# Patient Record
Sex: Female | Born: 1960 | Race: White | Hispanic: No | Marital: Married | Smoking: Former smoker
Health system: Southern US, Community
[De-identification: ages and names within clinical notes are randomized; demographics above are authoritative.]

## PROBLEM LIST (undated history)

## (undated) DIAGNOSIS — R569 Unspecified convulsions: Secondary | ICD-10-CM

## (undated) DIAGNOSIS — I10 Essential (primary) hypertension: Secondary | ICD-10-CM

## (undated) DIAGNOSIS — K5900 Constipation, unspecified: Secondary | ICD-10-CM

## (undated) DIAGNOSIS — K5120 Ulcerative (chronic) proctitis without complications: Secondary | ICD-10-CM

## (undated) HISTORY — DX: Ulcerative (chronic) proctitis without complications: K51.20

## (undated) HISTORY — DX: Constipation, unspecified: K59.00

## (undated) HISTORY — PX: WISDOM TOOTH EXTRACTION: SHX21

## (undated) HISTORY — DX: Essential (primary) hypertension: I10

## (undated) HISTORY — DX: Unspecified convulsions: R56.9

---

## 2004-07-08 HISTORY — PX: CEREBRAL ANEURYSM REPAIR: SHX164

## 2004-10-15 LAB — PROTIME-INR

## 2005-01-25 ENCOUNTER — Ambulatory Visit: Payer: Self-pay | Admitting: Family Medicine

## 2005-02-05 ENCOUNTER — Ambulatory Visit (HOSPITAL_COMMUNITY): Admission: RE | Admit: 2005-02-05 | Discharge: 2005-02-05 | Payer: Self-pay | Admitting: Family Medicine

## 2005-02-11 ENCOUNTER — Other Ambulatory Visit: Admission: RE | Admit: 2005-02-11 | Discharge: 2005-02-11 | Payer: Self-pay | Admitting: Family Medicine

## 2005-02-11 ENCOUNTER — Ambulatory Visit: Payer: Self-pay | Admitting: Family Medicine

## 2005-02-15 ENCOUNTER — Ambulatory Visit: Payer: Self-pay | Admitting: Family Medicine

## 2005-05-22 ENCOUNTER — Ambulatory Visit (HOSPITAL_COMMUNITY): Admission: RE | Admit: 2005-05-22 | Discharge: 2005-05-22 | Payer: Self-pay | Admitting: Internal Medicine

## 2005-05-22 ENCOUNTER — Ambulatory Visit: Payer: Self-pay | Admitting: Family Medicine

## 2005-06-13 ENCOUNTER — Encounter: Admission: RE | Admit: 2005-06-13 | Discharge: 2005-06-13 | Payer: Self-pay

## 2005-06-26 ENCOUNTER — Emergency Department (HOSPITAL_COMMUNITY): Admission: EM | Admit: 2005-06-26 | Discharge: 2005-06-26 | Payer: Self-pay | Admitting: Emergency Medicine

## 2005-06-28 ENCOUNTER — Ambulatory Visit: Payer: Self-pay | Admitting: Internal Medicine

## 2005-12-09 ENCOUNTER — Encounter: Admission: RE | Admit: 2005-12-09 | Discharge: 2005-12-09 | Payer: Self-pay | Admitting: Family Medicine

## 2005-12-09 ENCOUNTER — Ambulatory Visit: Payer: Self-pay | Admitting: Family Medicine

## 2006-02-25 ENCOUNTER — Other Ambulatory Visit: Admission: RE | Admit: 2006-02-25 | Discharge: 2006-02-25 | Payer: Self-pay | Admitting: Family Medicine

## 2006-02-25 ENCOUNTER — Ambulatory Visit: Payer: Self-pay | Admitting: Family Medicine

## 2006-03-25 ENCOUNTER — Ambulatory Visit (HOSPITAL_COMMUNITY): Admission: RE | Admit: 2006-03-25 | Discharge: 2006-03-25 | Payer: Self-pay | Admitting: Family Medicine

## 2006-04-22 ENCOUNTER — Ambulatory Visit: Payer: Self-pay | Admitting: Family Medicine

## 2006-07-16 ENCOUNTER — Ambulatory Visit: Payer: Self-pay | Admitting: Family Medicine

## 2006-07-16 LAB — CONVERTED CEMR LAB
BUN: 12 mg/dL (ref 6–23)
Basophils Absolute: 0.1 10*3/uL (ref 0.0–0.1)
Basophils Relative: 1 % (ref 0.0–1.0)
Creatinine, Ser: 0.7 mg/dL (ref 0.4–1.2)
Eosinophil percent: 1.7 % (ref 0.0–5.0)
HCT: 37.6 % (ref 36.0–46.0)
Hemoglobin: 12.5 g/dL (ref 12.0–15.0)
INR: 0.9 (ref 0.9–2.0)
Lymphocytes Relative: 21.6 % (ref 12.0–46.0)
MCHC: 33.4 g/dL (ref 30.0–36.0)
MCV: 91.1 fL (ref 78.0–100.0)
Monocytes Absolute: 0.5 10*3/uL (ref 0.2–0.7)
Monocytes Relative: 8.3 % (ref 3.0–11.0)
Neutro Abs: 3.9 10*3/uL (ref 1.4–7.7)
Neutrophils Relative %: 67.4 % (ref 43.0–77.0)
Platelets: 250 10*3/uL (ref 150–400)
Prothrombin Time: 11.7 s (ref 10.0–14.0)
RBC: 4.13 M/uL (ref 3.87–5.11)
RDW: 12.4 % (ref 11.5–14.6)
WBC: 5.9 10*3/uL (ref 4.5–10.5)
hCG, Beta Chain, Quant, S: 0.98 milliintl units/mL

## 2006-08-04 ENCOUNTER — Ambulatory Visit: Payer: Self-pay | Admitting: Family Medicine

## 2007-01-03 ENCOUNTER — Ambulatory Visit: Payer: Self-pay | Admitting: Internal Medicine

## 2007-02-26 DIAGNOSIS — I10 Essential (primary) hypertension: Secondary | ICD-10-CM | POA: Insufficient documentation

## 2007-03-11 ENCOUNTER — Ambulatory Visit: Payer: Self-pay | Admitting: Family Medicine

## 2007-03-11 ENCOUNTER — Telehealth (INDEPENDENT_AMBULATORY_CARE_PROVIDER_SITE_OTHER): Payer: Self-pay | Admitting: *Deleted

## 2007-03-18 ENCOUNTER — Encounter: Payer: Self-pay | Admitting: Family Medicine

## 2007-03-23 ENCOUNTER — Ambulatory Visit: Payer: Self-pay | Admitting: Family Medicine

## 2007-03-23 ENCOUNTER — Other Ambulatory Visit: Admission: RE | Admit: 2007-03-23 | Discharge: 2007-03-23 | Payer: Self-pay | Admitting: Family Medicine

## 2007-03-23 DIAGNOSIS — Z8669 Personal history of other diseases of the nervous system and sense organs: Secondary | ICD-10-CM | POA: Insufficient documentation

## 2007-03-23 LAB — CONVERTED CEMR LAB
ALT: 23 units/L (ref 0–35)
AST: 19 units/L (ref 0–37)
Albumin: 4 g/dL (ref 3.5–5.2)
Alkaline Phosphatase: 47 units/L (ref 39–117)
BUN: 9 mg/dL (ref 6–23)
Basophils Absolute: 0 10*3/uL (ref 0.0–0.1)
Basophils Relative: 0.8 % (ref 0.0–1.0)
Bilirubin, Direct: 0.1 mg/dL (ref 0.0–0.3)
CO2: 29 meq/L (ref 19–32)
Calcium: 9.4 mg/dL (ref 8.4–10.5)
Chloride: 107 meq/L (ref 96–112)
Cholesterol: 241 mg/dL (ref 0–200)
Creatinine, Ser: 0.6 mg/dL (ref 0.4–1.2)
Direct LDL: 177 mg/dL
Eosinophils Absolute: 0.1 10*3/uL (ref 0.0–0.6)
Eosinophils Relative: 1.7 % (ref 0.0–5.0)
GFR calc Af Amer: 137 mL/min
GFR calc non Af Amer: 113 mL/min
Glucose, Bld: 91 mg/dL (ref 70–99)
HCT: 37.7 % (ref 36.0–46.0)
HDL: 44.6 mg/dL (ref >39.0)
Hemoglobin: 12.9 g/dL (ref 12.0–15.0)
Lymphocytes Relative: 21 % (ref 12.0–46.0)
MCHC: 34.2 g/dL (ref 30.0–36.0)
MCV: 89.1 fL (ref 78.0–100.0)
Monocytes Absolute: 0.5 10*3/uL (ref 0.2–0.7)
Monocytes Relative: 7.5 % (ref 3.0–11.0)
Neutro Abs: 4.2 10*3/uL (ref 1.4–7.7)
Neutrophils Relative %: 69 % (ref 43.0–77.0)
Phenytoin Lvl: 8.2 ug/mL — ABNORMAL LOW (ref 10.0–20.0)
Platelets: 293 10*3/uL (ref 150–400)
Potassium: 4.2 meq/L (ref 3.5–5.1)
RBC: 4.23 M/uL (ref 3.87–5.11)
RDW: 13.2 % (ref 11.5–14.6)
Sodium: 141 meq/L (ref 135–145)
TSH: 1.32 microintl units/mL (ref 0.35–5.50)
Total Bilirubin: 0.7 mg/dL (ref 0.3–1.2)
Total CHOL/HDL Ratio: 5.4
Total Protein: 6.6 g/dL (ref 6.0–8.3)
Triglycerides: 135 mg/dL (ref 0–149)
VLDL: 27 mg/dL (ref 0–40)
WBC: 6.1 10*3/uL (ref 4.5–10.5)

## 2007-04-10 ENCOUNTER — Ambulatory Visit (HOSPITAL_COMMUNITY): Admission: RE | Admit: 2007-04-10 | Discharge: 2007-04-10 | Payer: Self-pay | Admitting: Family Medicine

## 2007-07-06 ENCOUNTER — Ambulatory Visit: Payer: Self-pay | Admitting: Family Medicine

## 2007-07-06 LAB — CONVERTED CEMR LAB
BUN: 9 mg/dL (ref 6–23)
Basophils Absolute: 0 10*3/uL (ref 0.0–0.1)
Basophils Relative: 0.8 % (ref 0.0–1.0)
Creatinine, Ser: 0.7 mg/dL (ref 0.4–1.2)
Eosinophils Absolute: 0.1 10*3/uL (ref 0.0–0.6)
Eosinophils Relative: 2.4 % (ref 0.0–5.0)
HCT: 38 % (ref 36.0–46.0)
Hemoglobin: 12.9 g/dL (ref 12.0–15.0)
INR: 0.9 (ref 0.8–1.0)
Lymphocytes Relative: 25.1 % (ref 12.0–46.0)
MCHC: 33.9 g/dL (ref 30.0–36.0)
MCV: 91 fL (ref 78.0–100.0)
Monocytes Absolute: 0.4 10*3/uL (ref 0.2–0.7)
Monocytes Relative: 6.5 % (ref 3.0–11.0)
Neutro Abs: 4.1 10*3/uL (ref 1.4–7.7)
Neutrophils Relative %: 65.2 % (ref 43.0–77.0)
Platelets: 260 10*3/uL (ref 150–400)
Prothrombin Time: 11.1 s (ref 10.9–13.3)
RBC: 4.18 M/uL (ref 3.87–5.11)
RDW: 13.2 % (ref 11.5–14.6)
WBC: 6.2 10*3/uL (ref 4.5–10.5)
aPTT: 26.8 s (ref 21.7–29.8)
hCG, Beta Chain, Quant, S: <0.5 milliintl units/mL

## 2008-02-11 ENCOUNTER — Ambulatory Visit: Payer: Self-pay | Admitting: Family Medicine

## 2008-02-11 LAB — CONVERTED CEMR LAB
ALT: 15 units/L (ref 0–35)
AST: 19 units/L (ref 0–37)
Albumin: 3.8 g/dL (ref 3.5–5.2)
Alkaline Phosphatase: 43 units/L (ref 39–117)
BUN: 9 mg/dL (ref 6–23)
Basophils Absolute: 0.1 10*3/uL (ref 0.0–0.1)
Basophils Relative: 0.9 % (ref 0.0–3.0)
Bilirubin, Direct: 0.1 mg/dL (ref 0.0–0.3)
CO2: 28 meq/L (ref 19–32)
Calcium: 9.1 mg/dL (ref 8.4–10.5)
Chloride: 107 meq/L (ref 96–112)
Cholesterol: 170 mg/dL (ref 0–200)
Creatinine, Ser: 0.8 mg/dL (ref 0.4–1.2)
Eosinophils Absolute: 0.1 10*3/uL (ref 0.0–0.7)
Eosinophils Relative: 2.3 % (ref 0.0–5.0)
GFR calc Af Amer: 98 mL/min
GFR calc non Af Amer: 81 mL/min
Glucose, Bld: 102 mg/dL — ABNORMAL HIGH (ref 70–99)
HCT: 36.7 % (ref 36.0–46.0)
HDL: 41.9 mg/dL (ref >39.0)
Hemoglobin: 12.5 g/dL (ref 12.0–15.0)
LDL Cholesterol: 108 mg/dL — ABNORMAL HIGH (ref 0–99)
Lymphocytes Relative: 24.1 % (ref 12.0–46.0)
MCHC: 34 g/dL (ref 30.0–36.0)
MCV: 90.2 fL (ref 78.0–100.0)
Monocytes Absolute: 0.4 10*3/uL (ref 0.1–1.0)
Monocytes Relative: 7.7 % (ref 3.0–12.0)
Neutro Abs: 3.8 10*3/uL (ref 1.4–7.7)
Neutrophils Relative %: 65 % (ref 43.0–77.0)
Platelets: 228 10*3/uL (ref 150–400)
Potassium: 4.1 meq/L (ref 3.5–5.1)
RBC: 4.07 M/uL (ref 3.87–5.11)
RDW: 12.9 % (ref 11.5–14.6)
Sodium: 140 meq/L (ref 135–145)
TSH: 1.27 microintl units/mL (ref 0.35–5.50)
Total Bilirubin: 0.9 mg/dL (ref 0.3–1.2)
Total CHOL/HDL Ratio: 4.1
Total Protein: 6.4 g/dL (ref 6.0–8.3)
Triglycerides: 101 mg/dL (ref 0–149)
VLDL: 20 mg/dL (ref 0–40)
WBC: 5.8 10*3/uL (ref 4.5–10.5)

## 2008-02-22 ENCOUNTER — Ambulatory Visit: Payer: Self-pay | Admitting: Family Medicine

## 2008-02-22 ENCOUNTER — Other Ambulatory Visit: Admission: RE | Admit: 2008-02-22 | Discharge: 2008-02-22 | Payer: Self-pay | Admitting: Family Medicine

## 2008-02-22 ENCOUNTER — Encounter: Payer: Self-pay | Admitting: Family Medicine

## 2008-02-22 DIAGNOSIS — N959 Unspecified menopausal and perimenopausal disorder: Secondary | ICD-10-CM | POA: Insufficient documentation

## 2008-04-29 ENCOUNTER — Ambulatory Visit (HOSPITAL_COMMUNITY): Admission: RE | Admit: 2008-04-29 | Discharge: 2008-04-29 | Payer: Self-pay | Admitting: Family Medicine

## 2009-01-31 ENCOUNTER — Ambulatory Visit: Payer: Self-pay | Admitting: Family Medicine

## 2009-01-31 DIAGNOSIS — T148XXA Other injury of unspecified body region, initial encounter: Secondary | ICD-10-CM | POA: Insufficient documentation

## 2009-01-31 LAB — CONVERTED CEMR LAB
ALT: 15 units/L (ref 0–35)
AST: 21 units/L (ref 0–37)
Albumin: 4.4 g/dL (ref 3.5–5.2)
Alkaline Phosphatase: 54 units/L (ref 39–117)
BUN: 12 mg/dL (ref 6–23)
Basophils Absolute: 0 10*3/uL (ref 0.0–0.1)
Basophils Relative: 0.7 % (ref 0.0–3.0)
Bilirubin Urine: NEGATIVE
Bilirubin, Direct: 0.1 mg/dL (ref 0.0–0.3)
Blood in Urine, dipstick: NEGATIVE
CO2: 31 meq/L (ref 19–32)
Calcium: 9.7 mg/dL (ref 8.4–10.5)
Chloride: 101 meq/L (ref 96–112)
Cholesterol: 214 mg/dL — ABNORMAL HIGH (ref 0–200)
Creatinine, Ser: 0.7 mg/dL (ref 0.4–1.2)
Direct LDL: 151.8 mg/dL
Eosinophils Absolute: 0.2 10*3/uL (ref 0.0–0.7)
Eosinophils Relative: 3.1 % (ref 0.0–5.0)
GFR calc non Af Amer: 93.93 mL/min (ref >60)
Glucose, Bld: 87 mg/dL (ref 70–99)
Glucose, Urine, Semiquant: NEGATIVE
HCT: 40.1 % (ref 36.0–46.0)
HDL: 50.8 mg/dL (ref >39.00)
Hemoglobin: 13.7 g/dL (ref 12.0–15.0)
Ketones, urine, test strip: NEGATIVE
Lymphocytes Relative: 26.2 % (ref 12.0–46.0)
Lymphs Abs: 1.5 10*3/uL (ref 0.7–4.0)
MCHC: 34.1 g/dL (ref 30.0–36.0)
MCV: 90.5 fL (ref 78.0–100.0)
Monocytes Absolute: 0.4 10*3/uL (ref 0.1–1.0)
Monocytes Relative: 7.3 % (ref 3.0–12.0)
Neutro Abs: 3.5 10*3/uL (ref 1.4–7.7)
Neutrophils Relative %: 62.7 % (ref 43.0–77.0)
Nitrite: NEGATIVE
Platelets: 272 10*3/uL (ref 150.0–400.0)
Potassium: 4.1 meq/L (ref 3.5–5.1)
Protein, U semiquant: NEGATIVE
RBC: 4.43 M/uL (ref 3.87–5.11)
RDW: 12 % (ref 11.5–14.6)
Sodium: 140 meq/L (ref 135–145)
Specific Gravity, Urine: 1.01
TSH: 0.93 microintl units/mL (ref 0.35–5.50)
Total Bilirubin: 1.5 mg/dL — ABNORMAL HIGH (ref 0.3–1.2)
Total CHOL/HDL Ratio: 4
Total Protein: 7.7 g/dL (ref 6.0–8.3)
Triglycerides: 66 mg/dL (ref 0.0–149.0)
Urobilinogen, UA: 0.2
VLDL: 13.2 mg/dL (ref 0.0–40.0)
WBC: 5.6 10*3/uL (ref 4.5–10.5)
pH: 5

## 2009-03-10 ENCOUNTER — Other Ambulatory Visit: Admission: RE | Admit: 2009-03-10 | Discharge: 2009-03-10 | Payer: Self-pay | Admitting: Family Medicine

## 2009-03-10 ENCOUNTER — Encounter: Payer: Self-pay | Admitting: Family Medicine

## 2009-03-10 ENCOUNTER — Ambulatory Visit: Payer: Self-pay | Admitting: Family Medicine

## 2009-04-04 ENCOUNTER — Ambulatory Visit: Payer: Self-pay | Admitting: Gastroenterology

## 2009-05-01 ENCOUNTER — Ambulatory Visit (HOSPITAL_COMMUNITY): Admission: RE | Admit: 2009-05-01 | Discharge: 2009-05-01 | Payer: Self-pay | Admitting: Family Medicine

## 2009-06-08 ENCOUNTER — Encounter (INDEPENDENT_AMBULATORY_CARE_PROVIDER_SITE_OTHER): Payer: Self-pay | Admitting: *Deleted

## 2009-07-06 ENCOUNTER — Encounter (INDEPENDENT_AMBULATORY_CARE_PROVIDER_SITE_OTHER): Payer: Self-pay | Admitting: *Deleted

## 2009-07-08 HISTORY — PX: COLONOSCOPY: SHX174

## 2009-07-11 ENCOUNTER — Ambulatory Visit: Payer: Self-pay | Admitting: Gastroenterology

## 2009-07-13 ENCOUNTER — Ambulatory Visit: Payer: Self-pay | Admitting: Family Medicine

## 2009-07-13 DIAGNOSIS — J309 Allergic rhinitis, unspecified: Secondary | ICD-10-CM | POA: Insufficient documentation

## 2009-07-21 ENCOUNTER — Ambulatory Visit: Payer: Self-pay | Admitting: Gastroenterology

## 2010-08-07 NOTE — Assessment & Plan Note (Signed)
Summary: BRUISE ON LEG/NJR   Vital Signs:  Patient profile:   50 year old female Weight:      134 pounds Temp:     98 degrees F BP sitting:   130 / 80  (right arm)  History of Present Illness: Ruth Stanley is a 50 year old female, who comes in today for evaluation of an unresolved hematoma on her right lower extremity.  Back in January.  She banged her right lower extremity in the middle of the night.  It caused a hematoma it and it bled.  The bleeding is resolved and not has still persisted.  Allergies: No Known Drug Allergies  Past History:  Past medical, surgical, family and social histories (including risk factors) reviewed, and no changes noted (except as noted below).  Past Medical History: Reviewed history from 03/23/2007 and no changes required. Hypertension Brain Aneurysm Seizure disorder  Past Surgical History: Reviewed history from 02/26/2007 and no changes required. Tubal ligation CB x1  Family History: Reviewed history from 02/26/2007 and no changes required. Family History of Stroke F 1st degree relative <60 Family History of Cardiovascular disorder  Social History: Reviewed history from 02/26/2007 and no changes required. Occupation: Married Former Smoker Alcohol use-yes Drug use-no  Review of Systems      See HPI  Physical Exam  General:  Well-developed,well-nourished,in no acute distress; alert,appropriate and cooperative throughout examination Msk:  small hematoma, right lower lateral, tibia. Pulses:  R and L carotid,radial,femoral,dorsalis pedis and posterior tibial pulses are full and equal bilaterally Extremities:  No clubbing, cyanosis, edema, or deformity noted with normal full range of motion of all joints.   Neurologic:  No cranial nerve deficits noted. Station and gait are normal. Plantar reflexes are down-going bilaterally. DTRs are symmetrical throughout. Sensory, motor and coordinative functions appear intact.   Impression &  Recommendations:  Problem # 1:  HEMATOMA (ICD-924.9) Assessment New  Complete Medication List: 1)  Lisinopril 5 Mg Tabs (Lisinopril) .... Take 1 tablet by mouth every morning 2)  Adult Aspirin Low Strength 81 Mg Tbdp (Aspirin) 3)  Lamictal 100 Mg Tabs (Lamotrigine) .... Take one tab two times a day  Other Orders: Venipuncture (50037) TLB-Lipid Panel (80061-LIPID) TLB-BMP (Basic Metabolic Panel-BMET) (04888-BVQXIHW) TLB-CBC Platelet - w/Differential (85025-CBCD) TLB-Hepatic/Liver Function Pnl (80076-HEPATIC) TLB-TSH (Thyroid Stimulating Hormone) (84443-TSH) UA Dipstick w/o Micro (automated)  (81003)  Patient Instructions: 1)  elevation and ice when that becomes throbbing. 2)  after your lab work schedule a 30 minute appointment.  The first or second week in August for your annual checkup  Laboratory Results   Urine Tests    Routine Urinalysis   Color: yellow Appearance: Clear Glucose: negative   (Normal Range: Negative) Bilirubin: negative   (Normal Range: Negative) Ketone: negative   (Normal Range: Negative) Spec. Gravity: 1.010   (Normal Range: 1.003-1.035) Blood: negative   (Normal Range: Negative) pH: 5.0   (Normal Range: 5.0-8.0) Protein: negative   (Normal Range: Negative) Urobilinogen: 0.2   (Normal Range: 0-1) Nitrite: negative   (Normal Range: Negative) Leukocyte Esterace: 1+   (Normal Range: Negative)    Comments: Joyce Gross  January 31, 2009 1:15 PM

## 2010-08-07 NOTE — Letter (Signed)
Summary: Previsit letter  [facility]  [place], [place] [address]   Phone: [phone]  Fax: [phone]       06/08/2009 MRN: [id number]  [facility] [address] [place], [place]  [address]  Dear Ms. Laduca,  Welcome to the Gastroenterology Division at [facility].    You are scheduled to see a nurse for your pre-procedure visit on 07/11/2009 at 8:00AM on the 3rd floor at [facility] [place] [employer].  We ask that you try to arrive at our office 15 minutes prior to your appointment time to allow for check-in.  Your nurse visit will consist of discussing your medical and surgical history, your immediate family medical history, and your medications.    Please bring a complete list of all your medications or, if you prefer, bring the medication bottles and we will list them.  We will need to be aware of both prescribed and over the counter drugs.  We will need to know exact dosage information as well.  If you are on blood thinners (Coumadin, Plavix, Aggrenox, Ticlid, etc.) please call our office today/prior to your appointment, as we need to consult with your physician about holding your medication.   Please be prepared to read and sign documents such as consent forms, a financial agreement, and acknowledgement forms.  If necessary, and with your consent, a friend or relative is welcome to sit-in on the nurse visit with you.  Please bring your insurance card so that we may make a copy of it.  If your insurance requires a referral to see a specialist, please bring your referral form from your primary care physician.  No co-pay is required for this nurse visit.     If you cannot keep your appointment, please call [phone] to cancel or reschedule prior to your appointment date.  This allows [place] the opportunity to schedule an appointment for another patient in need of care.    Thank you for choosing [facility] for your medical  needs.  We appreciate the opportunity to care for you.  Please visit [place] at our website  to learn more about our practice.                     Sincerely.                                                                                                                   The Gastroenterology Division

## 2010-08-07 NOTE — Miscellaneous (Signed)
Summary: [facility] Previsit/prep  Clinical Lists Changes  Medications: Added new medication of MOVIPREP 100 GM  SOLR (PEG-KCL-NACL-NASULF-NA ASC-C) As per prep instructions. - Signed Rx of MOVIPREP 100 GM  SOLR (PEG-KCL-NACL-NASULF-NA ASC-C) As per prep instructions.;  #1 x 0;  Signed;  Entered by: Emerson Monte RN;  Authorized by: Milus Banister MD;  Method [facility]: [facility]  [address] [facility] [address] [id number] [address], [place], [place]  [address], Ph: [phone] or [phone], Fax: [phone] Observations: Added new observation of NKA: T (04/04/2009 15:25)    Prescriptions: MOVIPREP 100 GM  SOLR (PEG-KCL-NACL-NASULF-NA ASC-C) As per prep instructions.  #1 x 0   Entered by:   Emerson Monte RN   Authorized by:   Milus Banister MD   Signed by:   Emerson Monte RN on 04/04/2009   Method used:   [facility]        [facility] [facility] [address] [id number]       [address]       [place], [place]  [address]       Ph: [phone] or [phone]       Fax: [phone]   RxID:   [id number]

## 2010-08-07 NOTE — Assessment & Plan Note (Signed)
Summary: ROA/FUP/RCD    History of Present Illness: we're behind and the patient got forgotten.  I called apologized to her for this.  She will be rescheduled for next Monday  Current Allergies: No known allergies         Complete Medication List: 1)  Lisinopril 5 Mg Tabs (Lisinopril) 2)  Adult Aspirin Low Strength 81 Mg Tbdp (Aspirin) 3)  Dilantin 100 Mg Caps (Phenytoin sodium extended) .... One bid     ]

## 2010-08-07 NOTE — Assessment & Plan Note (Signed)
Summary: cpx/jls   Vital Signs:  Patient Profile:   50 Years Old Female Height:     63 inches (160.02 cm) Weight:      137 pounds Temp:     98.0 degrees F oral Pulse rate:   68 / minute Pulse rhythm:   regular BP sitting:   130 / 90  (left arm) Cuff size:   regular  Vitals Entered By: Westley Hummer CMA (February 22, 2008 3:59 PM)                 Chief Complaint:  cpx.  History of Present Illness: Ruth Stanley is a 50 year old female, who comes in today for physical exam.  She had a history of a CNS aneurysm.  That was treated at [facility] in [place].  No sequelae has done well.  Her next checkup with them.  She states is 2 1/2 years from now.  She sees a neurologist, Dr. Doy Mince on a regular basis.  He has her on the Meckel under milligrams twice a day average is only taking it once a day.  She reports no seizures.  She also takes lisinopril 5 mg daily for hypertension, and states her blood pressure at home is within normal limits.  She is having trouble waking up at night with hot flushes for the past 6 months.  Her last period was in January of 2009.  She also complains of frequent urination with no other urinary tract symptoms.  She has nocturia x 1    Current Allergies: No known allergies   Past Medical History:    Reviewed history from 03/23/2007 and no changes required:       Hypertension       Brain Aneurysm       Seizure disorder   Family History:    Reviewed history from 02/26/2007 and no changes required:       Family History of Stroke F 1st degree relative <60       Family History of Cardiovascular disorder  Social History:    Reviewed history from 02/26/2007 and no changes required:       Occupation:       Married       Former Smoker       Alcohol use-yes       Drug use-no    Review of Systems      See HPI   Physical Exam  General:     Well-developed,well-nourished,in no acute distress; alert,appropriate and cooperative  throughout examination Head:     Normocephalic and atraumatic without obvious abnormalities. No apparent alopecia or balding. Eyes:     No corneal or conjunctival inflammation noted. EOMI. Perrla. Funduscopic exam benign, without hemorrhages, exudates or papilledema. Vision grossly normal. Ears:     External ear exam shows no significant lesions or deformities.  Otoscopic examination reveals clear canals, tympanic membranes are intact bilaterally without bulging, retraction, inflammation or discharge. Hearing is grossly normal bilaterally. Nose:     External nasal examination shows no deformity or inflammation. Nasal mucosa are pink and moist without lesions or exudates. Mouth:     Oral mucosa and oropharynx without lesions or exudates.  Teeth in good repair. Neck:     No deformities, masses, or tenderness noted. Chest Wall:     No deformities, masses, or tenderness noted. Breasts:     No mass, nodules, thickening, tenderness, bulging, retraction, inflamation, nipple discharge or skin changes noted.   Lungs:     Normal respiratory  effort, chest expands symmetrically. Lungs are clear to auscultation, no crackles or wheezes. Heart:     Normal rate and regular rhythm. S1 and S2 normal without gallop, murmur, click, rub or other extra sounds. Abdomen:     Bowel sounds positive,abdomen soft and non-tender without masses, organomegaly or hernias noted. Rectal:     No external abnormalities noted. Normal sphincter tone. No rectal masses or tenderness. Genitalia:     Pelvic Exam:        External: normal female genitalia without lesions or masses        Vagina: normal without lesions or masses        Cervix: normal without lesions or masses        Adnexa: normal bimanual exam without masses or fullness        Uterus: normal by palpation        Pap smear: performed Msk:     No deformity or scoliosis noted of thoracic or lumbar spine.   Pulses:     R and L carotid,radial,femoral,dorsalis  pedis and posterior tibial pulses are full and equal bilaterally Extremities:     No clubbing, cyanosis, edema, or deformity noted with normal full range of motion of all joints.   Neurologic:     No cranial nerve deficits noted. Station and gait are normal. Plantar reflexes are down-going bilaterally. DTRs are symmetrical throughout. Sensory, motor and coordinative functions appear intact. Skin:     Intact without suspicious lesions or rashes Cervical Nodes:     No lymphadenopathy noted Axillary Nodes:     No palpable lymphadenopathy Inguinal Nodes:     No significant adenopathy Psych:     Cognition and judgment appear intact. Alert and cooperative with normal attention span and concentration. No apparent delusions, illusions, hallucinations    Impression & Recommendations:  Problem # 1:  SEIZURE DISORDER (ICD-780.39) Assessment: Unchanged  The following medications were removed from the medication list:    Dilantin 100 Mg Caps (Phenytoin sodium extended) ..... One bid  Her updated medication list for this problem includes:    Lamictal 100 Mg Tabs (Lamotrigine) .Marland Kitchen... Take one tab two times a day   Problem # 2:  HYPERTENSION (ICD-401.9) Assessment: Improved  Her updated medication list for this problem includes:    Lisinopril 5 Mg Tabs (Lisinopril) .Marland Kitchen... Take 1 tablet by mouth every morning   Problem # 3:  PERIMENOPAUSAL SYNDROME (ICD-627.9) Assessment: New  Complete Medication List: 1)  Lisinopril 5 Mg Tabs (Lisinopril) .... Take 1 tablet by mouth every morning 2)  Adult Aspirin Low Strength 81 Mg Tbdp (Aspirin) 3)  Lamictal 100 Mg Tabs (Lamotrigine) .... Take one tab two times a day  Other Orders: EKG w/ Interpretation (93000)   Patient Instructions: 1)  It is important that you exercise regularly at least 20 minutes 5 times a week. If you develop chest pain, have severe difficulty breathing, or feel very tired , stop exercising immediately and seek medical  attention. 2)  Schedule your mammogram. 3)  Take calcium +Vitamin D daily. 4)  Take an Aspirin every day 5)  he may try over-the-counter medications, like a black cohosh to see if this will help stop the hot flushes.  If they get worse.  Please return for reevaluation.   6)  Please schedule a follow-up appointment in 1 year.   Prescriptions: LISINOPRIL 5 MG TABS (LISINOPRIL) Take 1 tablet by mouth every morning  #100 x 3   Entered and Authorized by:  Dorena Cookey MD   Signed by:   Dorena Cookey MD on 02/22/2008   Method used:   [facility] ...       [facility] [id number]       [address]       [place], [place]  [address]       Ph: [phone] or [phone]       Fax: [phone]   RxID:   [id number]  ]

## 2010-08-07 NOTE — Progress Notes (Signed)
Summary: NEEDS NEURO REFERRAL  Phone Note Call from Patient Call back at Home Phone [phone]   Caller: Spouse- ERIC Call For: DR TODD Summary of Call: WAS [place]. HER NEURO IN [place] WANTS HER TO SEE A NEURO DOCTOR HERE. PT IS REQUESTING A REFERRAL TO A NEUROLOGIST. Initial call taken by: Despina Arias,  March 11, 2007 9:40 AM  Follow-up for Phone Call        please call go for neurologic and get her set up for neurology consult.  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  She's done well but recently had a seizure.  She is on Dilantin 10 mg a day.  It's okay to send them a copy of her medical records.  See if Dr. love or Dr. Maude Leriche are available Follow-up by: Dorena Cookey MD,  March 11, 2007 10:49 AM  Additional Follow-up for Phone Call Additional follow up Details #1::        Faxed referral to [facility] Additional Follow-up by: Rolinda Roan,  March 11, 2007 11:16 AM         Appended Document: NEEDS NEURO REFERRAL 10/24 @ 9:30 with Dr.Reynolds/patient aware

## 2010-08-07 NOTE — Miscellaneous (Signed)
Summary: DIR COL/SCRNING...AS.    Clinical Lists Changes  Medications: Added new medication of MOVIPREP 100 GM  SOLR (PEG-KCL-NACL-NASULF-NA ASC-C) As directed - Signed Rx of MOVIPREP 100 GM  SOLR (PEG-KCL-NACL-NASULF-NA ASC-C) As directed;  #1 x 0;  Signed;  Entered by: Ernestine Conrad RN;  Authorized by: Milus Banister MD;  Method used: [facility] [id number] [address], [place], [place]  [address], Ph: [phone] or [phone], Fax: [phone] Observations: Added new observation of ALLERGY REV: Done (07/11/2009 7:50)    Prescriptions: MOVIPREP 100 GM  SOLR (PEG-KCL-NACL-NASULF-NA ASC-C) As directed  #1 x 0   Entered by:   Ernestine Conrad RN   Authorized by:   Milus Banister MD   Signed by:   Ernestine Conrad RN on 07/11/2009   Method used:   [facility]        [facility] [id number]       [address]       [place], [place]  [address]       Ph: [phone] or [phone]       Fax: [phone]   RxID:   [id number]

## 2010-08-07 NOTE — Assessment & Plan Note (Signed)
Summary: sinuses//ccm/pt rescd//ccm   Vital Signs:  Patient profile:   50 year old female Menstrual status:  perimenopausal Temp:     98.5 degrees F oral BP sitting:   110 / 78  (left arm) Cuff size:   regular  Vitals Entered By: Westley Hummer CMA Deborra Medina) (July 13, 2009 2:14 PM)  Reason for Visit sinus pressure  History of Present Illness: Ruth Stanley is a 50 year old, married female, nonsmoker, who comes in with a two week history of head congestion she has no fever, earache, sore throat, or cough.  Environmentally they have a dog at home, feather pillows, and down comforters.  Review of systems otherwise negative  Allergies (verified): No Known Drug Allergies  Past History:  Past medical, surgical, family and social histories (including risk factors) reviewed for relevance to current acute and chronic problems.  Past Medical History: Reviewed history from 03/23/2007 and no changes required. Hypertension Brain Aneurysm Seizure disorder  Past Surgical History: Reviewed history from 02/26/2007 and no changes required. Tubal ligation CB x1  Family History: Reviewed history from 02/26/2007 and no changes required. Family History of Stroke F 1st degree relative <60 Family History of Cardiovascular disorder  Social History: Reviewed history from 02/26/2007 and no changes required. Occupation: Married Former Smoker Alcohol use-yes Drug use-no  Review of Systems      See HPI  Physical Exam  General:  Well-developed,well-nourished,in no acute distress; alert,appropriate and cooperative throughout examination Head:  Normocephalic and atraumatic without obvious abnormalities. No apparent alopecia or balding. Eyes:  No corneal or conjunctival inflammation noted. EOMI. Perrla. Funduscopic exam benign, without hemorrhages, exudates or papilledema. Vision grossly normal. Ears:  External ear exam shows no significant lesions or deformities.  Otoscopic examination reveals  clear canals, tympanic membranes are intact bilaterally without bulging, retraction, inflammation or discharge. Hearing is grossly normal bilaterally. Nose:  septum and the 4+ nasal edema Mouth:  Oral mucosa and oropharynx without lesions or exudates.  Teeth in good repair.   Problems:  Medical Problems Added: 1)  Dx of Rhinitis  (ICD-477.9)  Impression & Recommendations:  Problem # 1:  RHINITIS (BJS-283.9) Assessment New  Her updated medication list for this problem includes:    Flonase 50 Mcg/act Susp (Fluticasone propionate) ..... Uad  Orders: Prescription Created Electronically [phone])  Complete Medication List: 1)  Lisinopril 5 Mg Tabs (Lisinopril) .... Take 1 tablet by mouth every morning 2)  Adult Aspirin Low Strength 81 Mg Tbdp (Aspirin) 3)  Lamictal 100 Mg Tabs (Lamotrigine) .... Take one tab two times a day 4)  Prednisone 20 Mg Tabs (Prednisone) .... Uad 5)  Flonase 50 Mcg/act Susp (Fluticasone propionate) .... Uad  Patient Instructions: 1)  environmental.......... no feather pillows, wool , or down comforters. 2)  Begin prednisone two tabs x 3 days, one x 3 days, a half x 3 days, then a half a tablet Monday, Wednesday, Friday, for a two-week taper. 3)  At that time.  Use afrin  nasal spray, steroid nasal spray, and then irrigate both nostrils with warm salt water with thenetti pot.  There is a 5 night limited on the afrin  right eyePrescriptions: FLONASE 50 MCG/ACT SUSP (FLUTICASONE PROPIONATE) UAD  #1 x 2   Entered and Authorized by:   Dorena Cookey MD   Signed by:   Dorena Cookey MD on 07/13/2009   Method used:   [facility]        [facility] [id number]  [address]       [place], [place]  [address]       Ph: [phone] or [phone]       Fax: [phone]   RxID:   [id number] PREDNISONE 20 MG TABS (PREDNISONE) UAD  #30 x 1   Entered and Authorized by:   Dorena Cookey MD   Signed by:   Dorena Cookey MD on 07/13/2009   Method  used:   [facility]        [facility] [id number]       [address]       [place],   [address]       Ph: [phone] or [phone]       Fax: [phone]   RxID:   [id number]

## 2010-08-07 NOTE — Assessment & Plan Note (Signed)
Summary: cpx//pap//lh   Vital Signs:  Patient profile:   50 year old female Menstrual status:  perimenopausal Height:      64 inches Weight:      130 pounds BMI:     22.40 Temp:     98.2 degrees F oral BP sitting:   110 / 80  (left arm) Cuff size:   regular  Vitals Entered By: Westley Hummer CMA Deborra Medina) (March 10, 2009 3:10 PM)  Reason for Visit cpx  History of Present Illness: Ruth Stanley  is a 50 year old female, who comes in today for examination  She is perimenopausal.  Her last period was in May.  Cardiovascular.  Six months ago.  She's having some minor symptoms, but nothing major.  She takes lisinopril, 5 mg daily for hypertension, and BP 110/80.  She also takes Lamictal 100 mg b.i.d. for mood via her psychiatrist, Dr.  Silversmith.  However, she is not getting any therapy that is writing her medications.  We will therefore write her medications here.  She gets routine eye care.  Dental care does BSE monthly and gets any mammography.  Colonoscopy is due.  She is 50.  Last tetanus 2,006.  neurologically stable.  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  Past Medical History: Reviewed history from 03/23/2007 and no changes required. Hypertension Brain Aneurysm Seizure disorder  Past Surgical History: Reviewed history from 02/26/2007 and no changes required. Tubal ligation CB x1  Family History: Reviewed history from 02/26/2007 and no changes required. Family History of Stroke F 1st degree relative <60 Family History of Cardiovascular disorder  Social History: Reviewed history from 02/26/2007 and no changes required. Occupation: Married Former Smoker Alcohol use-yes Drug use-no  Review of Systems      See HPI  Physical Exam  General:   Well-developed,well-nourished,in no acute distress; alert,appropriate and cooperative throughout examination Head:  Normocephalic and atraumatic without obvious abnormalities. No apparent alopecia or balding. Eyes:  No corneal or conjunctival inflammation noted. EOMI. Perrla. Funduscopic exam benign, without hemorrhages, exudates or papilledema. Vision grossly normal. Ears:  External ear exam shows no significant lesions or deformities.  Otoscopic examination reveals clear canals, tympanic membranes are intact bilaterally without bulging, retraction, inflammation or discharge. Hearing is grossly normal bilaterally. Nose:  External nasal examination shows no deformity or inflammation. Nasal mucosa are pink and moist without lesions or exudates. Mouth:  Oral mucosa and oropharynx without lesions or exudates.  Teeth in good repair. Neck:  No deformities, masses, or tenderness noted. Chest Wall:  No deformities, masses, or tenderness noted. Breasts:  No mass, nodules, thickening, tenderness, bulging, retraction, inflamation, nipple discharge or skin changes noted.   Lungs:  Normal respiratory effort, chest expands symmetrically. Lungs are clear to auscultation, no crackles or wheezes. Heart:  Normal rate and regular rhythm. S1 and S2 normal without gallop, murmur, click, rub or other extra sounds. Abdomen:  Bowel sounds positive,abdomen soft and non-tender without masses, organomegaly or hernias noted. Rectal:  No external abnormalities noted. Normal sphincter tone. No rectal masses or tenderness. Genitalia:  Pelvic Exam:        External: normal female genitalia without lesions or masses        Vagina: normal without lesions or masses        Cervix: normal without lesions or masses  Adnexa: normal bimanual exam without masses or fullness        Uterus: normal by palpation        Pap smear: performed Msk:  No deformity or scoliosis noted of thoracic or lumbar spine.   Pulses:  R and L  carotid,radial,femoral,dorsalis pedis and posterior tibial pulses are full and equal bilaterally Extremities:  No clubbing, cyanosis, edema, or deformity noted with normal full range of motion of all joints.   Neurologic:  No cranial nerve deficits noted. Station and gait are normal. Plantar reflexes are down-going bilaterally. DTRs are symmetrical throughout. Sensory, motor and coordinative functions appear intact. Skin:  Intact without suspicious lesions or rashes Cervical Nodes:  No lymphadenopathy noted Axillary Nodes:  No palpable lymphadenopathy Inguinal Nodes:  No significant adenopathy Psych:  Cognition and judgment appear intact. Alert and cooperative with normal attention span and concentration. No apparent delusions, illusions, hallucinations   Impression & Recommendations:  Problem # 1:  PERIMENOPAUSAL SYNDROME (ICD-627.9) Assessment Unchanged  Orders: Prescription Created Electronically [phone])  Problem # 2:  HYPERTENSION (ICD-401.9) Assessment: Improved  Her updated medication list for this problem includes:    Lisinopril 5 Mg Tabs (Lisinopril) .Marland Kitchen... Take 1 tablet by mouth every morning  Orders: Prescription Created Electronically [phone])  Complete Medication List: 1)  Lisinopril 5 Mg Tabs (Lisinopril) .... Take 1 tablet by mouth every morning 2)  Adult Aspirin Low Strength 81 Mg Tbdp (Aspirin) 3)  Lamictal 100 Mg Tabs (Lamotrigine) .... Take one tab two times a day  Other Orders: Gastroenterology Referral (GI)  Patient Instructions: 1)  Please schedule a follow-up appointment in 1 year. 2)  It is important that you exercise regularly at least 20 minutes 5 times a week. If you develop chest pain, have severe difficulty breathing, or feel very tired , stop exercising immediately and seek medical attention. 3)  Schedule your mammogram. 4)  Schedule a colonoscopy/sigmoidoscopy to help detect colon cancer. 5)  Take calcium +Vitamin D daily. 6)  Take an Aspirin every  day. Prescriptions: LAMICTAL 100 MG  TABS (LAMOTRIGINE) take one tab two times a day  #200 x 3   Entered and Authorized by:   Dorena Cookey MD   Signed by:   Dorena Cookey MD on 03/10/2009   Method used:   Print then Give to Patient   RxID:   [id number] LISINOPRIL 5 MG TABS (LISINOPRIL) Take 1 tablet by mouth every morning  #100 x 3   Entered and Authorized by:   Dorena Cookey MD   Signed by:   Dorena Cookey MD on 03/10/2009   Method used:   Print then Give to Patient   RxID:   [id number]

## 2010-08-07 NOTE — Letter (Signed)
Summary: [facility] Instructions  [facility]  [place], [place] [address]   Phone: [phone]  Fax: [phone]       Ruth Stanley    [birth year]    MRN: [id number]        Procedure Day Sudie Grumbling:  Wendi Snipes  07/21/09     Arrival Time:  8:30AM     Procedure Time:9:30AM     Location of Procedure:                    [facility]  [facility] ([address]th Floor)   [place]   Starting 5 days prior to your procedure 1/9/11do not eat nuts, seeds, popcorn, corn, beans, peas,  salads, or any raw vegetables.  Do not take any fiber supplements (e.g. Metamucil, Citrucel, and Benefiber).  THE DAY BEFORE YOUR PROCEDURE         DATE: 07/20/09  DAY: THURSDAY  1.  Drink clear liquids the entire day-NO SOLID FOOD  2.  Do not drink anything colored red or purple.  Avoid juices with pulp.  No orange juice.  3.  Drink at least 64 oz. (8 glasses) of fluid/clear liquids during the day to prevent dehydration and help the prep work efficiently.  CLEAR LIQUIDS INCLUDE: Water Jello Ice Popsicles Tea (sugar ok, no milk/cream) Powdered fruit flavored drinks Coffee (sugar ok, no milk/cream) Gatorade Juice: apple, white grape, white cranberry  Lemonade Clear bullion, consomm, broth Carbonated beverages (any kind) Strained chicken noodle soup Hard Candy                             4.  In the morning, mix first dose of MoviPrep solution:    Empty 1 Pouch A and 1 Pouch B into the disposable container    Add lukewarm drinking water to the top line of the container. Mix to dissolve    Refrigerate (mixed solution should be used within 24 hrs)  5.  Begin drinking the prep at 5:00 p.m. The MoviPrep container is divided by 4 marks.   Every 15 minutes drink the solution down to the next mark (approximately 8 oz) until the full liter is complete.   6.  Follow completed prep with 16 oz of clear liquid of your choice (Nothing red or purple).   Continue to drink clear liquids until bedtime.  7.  Before going to bed, mix second dose of MoviPrep solution:    Empty 1 Pouch A and 1 Pouch B into the disposable container    Add lukewarm drinking water to the top line of the container. Mix to dissolve    Refrigerate  THE DAY OF YOUR PROCEDURE      DATE: 07/21/09 DAY: FRIDAY  Beginning at 4:30a.m. (5 hours before procedure):         1. Every 15 minutes, drink the solution down to the next mark (approx 8 oz) until the full liter is complete.  2. Follow completed prep with 16 oz. of clear liquid of your choice.    3. You may drink clear liquids until 7:30AM (2 HOURS BEFORE PROCEDURE).   MEDICATION INSTRUCTIONS  Unless otherwise instructed, you should take regular prescription medications with a small sip of water   as early as possible the morning of your procedure.           OTHER INSTRUCTIONS  You will need a responsible adult at least 52  years of age to accompany you and drive you home.   This person must remain in the waiting room during your procedure.  Wear loose fitting clothing that is easily removed.  Leave jewelry and other valuables at home.  However, you may wish to bring a book to read or  an iPod/MP3 player to listen to music as you wait for your procedure to start.  Remove all body piercing jewelry and leave at home.  Total time from sign-in until discharge is approximately 2-3 hours.  You should go home directly after your procedure and rest.  You can resume normal activities the  day after your procedure.  The day of your procedure you should not:   Drive   Make legal decisions   Operate machinery   Drink alcohol   Return to work  You will receive specific instructions about eating, activities and medications before you leave.    The above instructions have been reviewed and explained to me by   Ernestine Conrad, RN_______________________    I fully understand and can verbalize these  instructions _____________________________ Date _________

## 2010-08-07 NOTE — Assessment & Plan Note (Signed)
Summary: PAP/ROA/PT RESCD/CCM   Vital Signs:  Patient Profile:   50 Years Old Female Height:     63 inches (160.02 cm) Weight:      141 pounds (64.09 kg) Temp:     98.1 degrees F (36.72 degrees C) oral BP sitting:   130 / 80  (right arm)  Pt. in pain?   no  Vitals Entered By: Vilinda Blanks, RN (March 23, 2007 8:57 AM)                  Chief Complaint:  F/U & PAP .  History of Present Illness: Ruth Stanley is a 50 year old, married female, gravida one, para one, AB zero, who at age 1.  He continues to have regular periods.  She had a BTL for birth control.  She is beginning to have some hot flashes, but there minimal.  She takes Dilantin hundred milligrams b.i.d. for his seizure disorder.  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  That was in December of 2006.  She is asymptomatic until about 6 weeks ago, when she had a seizure.  She was seen by [place] placed on Dilantin hundred milligrams b.i.d.  She has a neurology consult October 24 for further evaluation.  She also takes lisinopril 10 mg a half a tablet a day for hypertension and an 81-mg baby aspirin.  She is up in our health maintenance.  Activities.  Family history via systems, etc. it was reviewed in detail the been no changes.  Acute Visit History:      She denies abdominal pain, chest pain, constipation, cough, diarrhea, earache, eye symptoms, fever, genitourinary symptoms, headache, musculoskeletal symptoms, nasal discharge, nausea, rash, sinus problems, sore throat, and vomiting.         Current Allergies: No known allergies   Past Medical History:    Hypertension    Brain Aneurysm    Seizure disorder   Family History:    Reviewed history from 02/26/2007 and no changes required:       Family History of Stroke F 1st degree relative <60       Family History of Cardiovascular disorder  Social History:    Reviewed history from 02/26/2007 and no changes required:  Occupation:       Married       Former Smoker       Alcohol use-yes       Drug use-no   Risk Factors:  Exercise:  no   Review of Systems      See HPI   Physical Exam  General:     Well-developed,well-nourished,in no acute distress; alert,appropriate and cooperative throughout examination Head:     Normocephalic and atraumatic without obvious abnormalities. No apparent alopecia or balding. Eyes:     No corneal or conjunctival inflammation noted. EOMI. Perrla. Funduscopic exam benign, without hemorrhages, exudates or papilledema. Vision grossly normal. Ears:     External ear exam shows no significant lesions or deformities.  Otoscopic examination reveals clear canals, tympanic membranes are intact bilaterally without bulging, retraction, inflammation or discharge. Hearing is grossly normal bilaterally. Nose:     External nasal examination shows no deformity or inflammation. Nasal mucosa are pink and moist without lesions or exudates. Mouth:     Oral mucosa and oropharynx without lesions or exudates.  Teeth in good repair. Neck:     No deformities, masses, or tenderness noted. Chest Wall:     No deformities, masses,  or tenderness noted. Breasts:     No mass, nodules, thickening, tenderness, bulging, retraction, inflamation, nipple discharge or skin changes noted.   Lungs:     Normal respiratory effort, chest expands symmetrically. Lungs are clear to auscultation, no crackles or wheezes. Heart:     Normal rate and regular rhythm. S1 and S2 normal without gallop, murmur, click, rub or other extra sounds. Abdomen:     Bowel sounds positive,abdomen soft and non-tender without masses, organomegaly or hernias noted. Rectal:     No external abnormalities noted. Normal sphincter tone. No rectal masses or tenderness. Genitalia:     Pelvic Exam:        External: normal female genitalia without lesions or masses        Vagina: normal without lesions or masses        Cervix: normal  without lesions or masses        Adnexa: normal bimanual exam without masses or fullness        Uterus: normal by palpation        Pap smear: performed Msk:     No deformity or scoliosis noted of thoracic or lumbar spine.   Pulses:     R and L carotid,radial,femoral,dorsalis pedis and posterior tibial pulses are full and equal bilaterally Extremities:     No clubbing, cyanosis, edema, or deformity noted with normal full range of motion of all joints.   Neurologic:     No cranial nerve deficits noted. Station and gait are normal. Plantar reflexes are down-going bilaterally. DTRs are symmetrical throughout. Sensory, motor and coordinative functions appear intact. Skin:     thorough skin exam showed normal skin patient is to light skin in my eyes and has some sun damage to her face.  She is advised to wear sunscreen SPF 68 and a hat with a bill Cervical Nodes:     No lymphadenopathy noted Axillary Nodes:     No palpable lymphadenopathy Inguinal Nodes:     No significant adenopathy Psych:     Cognition and judgment appear intact. Alert and cooperative with normal attention span and concentration. 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 One bid  Orders: Venipuncture (47829) TLB-Lipid Panel (80061-LIPID) TLB-BMP (Basic Metabolic Panel-BMET) (56213-YQMVHQI) TLB-CBC Platelet - w/Differential (85025-CBCD) TLB-Hepatic/Liver Function Pnl (80076-HEPATIC) TLB-TSH (Thyroid Stimulating Hormone) (84443-TSH) T- * Misc. Laboratory test [phone])   Problem # 2:  HYPERTENSION (ICD-401.9) Assessment: Improved  Her updated medication list for this problem includes:    Lisinopril 5 Mg Tabs (Lisinopril)  Orders: Venipuncture (52841) TLB-Lipid Panel (80061-LIPID) TLB-BMP  (Basic Metabolic Panel-BMET) (32440-NUUVOZD) TLB-CBC Platelet - w/Differential (85025-CBCD) TLB-Hepatic/Liver Function Pnl (80076-HEPATIC) TLB-TSH (Thyroid Stimulating Hormone) (84443-TSH)   Complete Medication List: 1)  Lisinopril 5 Mg Tabs (Lisinopril) 2)  Adult Aspirin Low Strength 81 Mg Tbdp (Aspirin) 3)  Dilantin 100 Mg Caps (Phenytoin sodium extended) .... One bid  Other Orders: EKG w/ Interpretation (93000)     Prescriptions: DILANTIN 100 MG  CAPS (PHENYTOIN SODIUM EXTENDED) one bid Brand medically necessary #200 x 4   Entered and Authorized by:   Dorena Cookey MD   Signed by:   Dorena Cookey MD on 03/23/2007   Method used:   Print then Give to Patient   RxID:   [id number] LISINOPRIL 5 MG  TABS (LISINOPRIL)   #100 x 4   Entered and Authorized by:   Dorena Cookey MD   Signed by:   Dorena Cookey MD on 03/23/2007   Method used:   Print then Give to Patient   RxID:   [id number]  ]

## 2010-08-07 NOTE — Assessment & Plan Note (Signed)
Summary: PT FELL/CCM   Vital Signs:  Patient Profile:   50 Years Old Female Height:     63 inches (160.02 cm) Weight:      138 pounds (62.73 kg) BP sitting:   150 / 88  (right arm)  Pt. in pain?   no  Vitals Entered By: Vilinda Blanks, RN (March 11, 2007 8:42 AM)                  Chief Complaint:  syncope- fri around 130pm pt's blackberry showed messages not answerd, pt awoke at 430pm face down in bed with lacerations on legs, arms, amd face.  Pt found the dinnig room chair chair had been knocked into the wall , and making a hole in the drywall. Pt has no recollection of anything between 130 and 430 pm..  History of Present Illness: Helene Kelp comes in today for evaluation of a spell.  She says last Friday, August 29.  She was at home.  There is a time gap between one 130 and 430.  She will face down in the bed.  She does not recall what happened.  His memory loss.  The event.  When she woke up she was fac lung about her business Friday, Saturday, Sunday, called and came in, Wednesday, September 3 for either.  She never had a problem with this in the past.  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  That was treated in [place], [place], by Dr Daniel Nones .  She was there in December with several follow-up and doing well.  No problems.  He initially was treated with a stent to the R.MCA.  She was on prophylactic Dilantin until December 07, and that was stopped.  She's had no recurrent problems.  HerDilantin dose at that time was on her milligrams b.i.d.  Acute Visit History:      She denies abdominal pain, chest pain, constipation, cough, diarrhea, earache, eye symptoms, fever, genitourinary symptoms, headache, musculoskeletal symptoms, nasal discharge, nausea, rash, sinus problems, sore throat, and vomiting.         Current Allergies: No known allergies   Past Medical History:    Reviewed history from 02/26/2007 and no changes required:       Hypertension       Brain  Aneurysm   Social History:    Reviewed history from 02/26/2007 and no changes required:       Occupation:       Married       Former Smoker       Alcohol use-yes       Drug use-no    Review of Systems  The patient denies anorexia, fever, weight loss, vision loss, decreased hearing, hoarseness, chest pain, syncope, dyspnea on exhertion, peripheral edema, prolonged cough, hemoptysis, abdominal pain, melena, hematochezia, severe indigestion/heartburn, hematuria, incontinence, genital sores, muscle weakness, suspicious skin lesions, transient blindness, difficulty walking, depression, unusual weight change, abnormal bleeding, enlarged lymph nodes, angioedema, breast masses, and testicular masses.     Physical Exam  General:     Well-developed,well-nourished,in no acute distress; alert,appropriate and cooperative throughout examination Head:     Normocephalic and atraumatic without obvious abnormalities. No apparent alopecia or balding.diffuse alopecia.   Eyes:     No corneal or conjunctival inflammation noted. EOMI. Perrla. Funduscopic exam benign, without hemorrhages, exudates or papilledema. Vision grossly normal. Ears:     External ear exam shows no significant lesions or deformities.  Otoscopic examination reveals clear canals, tympanic membranes are intact  bilaterally without bulging, retraction, inflammation or discharge. Hearing is grossly normal bilaterally. Nose:     External nasal examination shows no deformity or inflammation. Nasal mucosa are pink and moist without lesions or exudates. Mouth:     Oral mucosa and oropharynx without lesions or exudates.  Teeth in good repair. Neck:     No deformities, masses, or tenderness noted. Lungs:     Normal respiratory effort, chest expands symmetrically. Lungs are clear to auscultation, no crackles or wheezes. Heart:     Normal rate and regular rhythm. S1 and S2 normal without gallop, murmur, click, rub or other extra  sounds. Abdomen:     Bowel sounds positive,abdomen soft and non-tender without masses, organomegaly or hernias noted. Msk:     No deformity or scoliosis noted of thoracic or lumbar spine.   Pulses:     R and L carotid,radial,femoral,dorsalis pedis and posterior tibial pulses are full and equal bilaterally Extremities:     No clubbing, cyanosis, edema, or deformity noted with normal full range of motion of all joints.   Neurologic:     No cranial nerve deficits noted. Station and gait are normal. Plantar reflexes are down-going bilaterally. DTRs are symmetrical throughout. Sensory, motor and coordinative functions appear intact. Skin:     she has bruises on her right face, right and left knee and right wrist. Psych:     Cognition and judgment appear intact. Alert and cooperative with normal attention span and concentration. 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 One bid   Complete Medication List: 1)  Lisinopril 5 Mg Tabs (Lisinopril) 2)  Adult Aspirin Low Strength 81 Mg Tbdp (Aspirin) 3)  Dilantin 100 Mg Caps (Phenytoin sodium extended) .... One bid   Patient Instructions: 1)  take two Dilantin now, then one twice a day.  Call your doctor in [place] to tell them what happened and to find out how they want to pursue this.  We need to get a follow-up Dilantin level in one week 2)  also because she had a, seizure, it's important he not drive until we get this evaluated.    Prescriptions: DILANTIN 100 MG  CAPS (PHENYTOIN SODIUM EXTENDED) one bid Brand medically necessary #200 x 4   Entered and Authorized by:   Dorena Cookey MD   Signed by:   Dorena Cookey MD on 03/11/2007   Method used:   Electronically sent to ...       [facility] [phone] [place] [address]       [address] [place], [place]  [address]       Ph: [phone] or [phone]       Fax: [phone]   RxID:   [id number]   Handout requested.

## 2010-08-07 NOTE — Procedures (Signed)
Summary: Colonoscopy  Patient: [name] Note: All result statuses are Final unless otherwise noted.  Tests: (1) Colonoscopy (COL)   COL Colonoscopy           [facility]     [place], [place]  [id number]           COLONOSCOPY PROCEDURE REPORT           PATIENT:  Ruth Stanley, Ruth Stanley  MR#:  [id number]     BIRTHDATE:  [birth year], 49 yrs. old  GENDER:  female           ENDOSCOPIST:  Milus Banister, MD     Referred by:  Jory Ee Sherren Mocha, M.D.           PROCEDURE DATE:  07/21/2009     PROCEDURE:  Colonoscopy with snare polypectomy     ASA CLASS:  Class II     INDICATIONS:  Routine Risk Screening           MEDICATIONS:   Fentanyl 75 mcg IV, Versed 8 mg IV           DESCRIPTION OF PROCEDURE:   After the risks benefits and     alternatives of the procedure were thoroughly explained, informed     consent was obtained.  Digital rectal exam was performed and     revealed no rectal masses.   The LB PCF-H180AL [id number] endoscope     was introduced through the anus and advanced to the cecum, which     was identified by both the appendix and ileocecal valve, without     limitations.  The quality of the prep was adequate, using     MoviPrep.  The instrument was then slowly withdrawn as the colon     was fully examined.     <<PROCEDUREIMAGES>>           FINDINGS:  A sessile polyp was found in the proximal transverse     colon. This measured 4-92m across, there appeared to be minor     associated purulence (see picture) however there were no nearby     diverticulosis. The polyp was removed with snare/cautery. Unable     to be retrieved (see image4 and image3).  Mild diverticulosis was     found in the sigmoid colon (see image5).  This was otherwise a     normal examination of the colon (see image6, image1, and image2).     Retroflexed views in the rectum revealed no abnormalities.    The     scope was then withdrawn from the patient and the  procedure     completed.           COMPLICATIONS:  None           ENDOSCOPIC IMPRESSION:     1) Sessile polyp in the proximal transverse colon, removed but     unable to be retrieved     2) Mild diverticulosis in the sigmoid colon     3) Otherwise normal examination           RECOMMENDATIONS:     Repeat colonoscopy in 5 years.  If no adenomas removed at that     point, would recommend next colonoscopy for routine screening to     be done at 10 years.           REPEAT EXAM:  5 years  ______________________________     Milus Banister, MD           n.     eSIGNED:   Milus Banister at 07/21/2009 09:24 AM           Nehemiah Massed, [phone]  Note: An exclamation mark (!) indicates a result that was not dispersed into the flowsheet. Document Creation Date: 07/21/2009 9:24 AM _______________________________________________________________________  (1) Order result status: Final Collection or observation date-time: 07/21/2009 09:19 Requested date-time:  Receipt date-time:  Reported date-time:  Referring Physician:   Ordering Physician: Owens Loffler [phone]) Specimen Source:  Source: Tawanna Cooler Order Number: [id number] Lab site:

## 2010-11-20 NOTE — Assessment & Plan Note (Signed)
[facility]                                 ON-CALL NOTE   NAME:MCCAULEYShaley, Leavens                     MRN:          [id number]  DATE:01/03/2007                            DOB:          1960-10-31    Patient of Dr. Sherren Mocha called at 10:21 a.m. on June 28, [phone].  She was  having chills and vomiting and was given an appointment to come in to  the Saturday clinic.     Venia Carbon, MD  Electronically Signed    RIL/MedQ  DD: 01/03/2007  DT: 01/03/2007  Job #: [id number]   cc:   Dellis Filbert A. Sherren Mocha, MD

## 2011-01-03 ENCOUNTER — Other Ambulatory Visit (INDEPENDENT_AMBULATORY_CARE_PROVIDER_SITE_OTHER): Payer: BC Managed Care – PPO

## 2011-01-03 DIAGNOSIS — I671 Cerebral aneurysm, nonruptured: Secondary | ICD-10-CM

## 2011-01-03 DIAGNOSIS — I609 Nontraumatic subarachnoid hemorrhage, unspecified: Secondary | ICD-10-CM

## 2011-01-03 LAB — CBC WITH DIFFERENTIAL/PLATELET
Basophils Absolute: 0 10*3/uL (ref 0.0–0.1)
Basophils Relative: 0.9 % (ref 0.0–3.0)
Eosinophils Absolute: 0.2 10*3/uL (ref 0.0–0.7)
Eosinophils Relative: 3.3 % (ref 0.0–5.0)
HCT: 39.4 % (ref 36.0–46.0)
Hemoglobin: 13.3 g/dL (ref 12.0–15.0)
Lymphocytes Relative: 24.3 % (ref 12.0–46.0)
Lymphs Abs: 1.2 10*3/uL (ref 0.7–4.0)
MCHC: 33.7 g/dL (ref 30.0–36.0)
MCV: 91.2 fl (ref 78.0–100.0)
Monocytes Absolute: 0.3 10*3/uL (ref 0.1–1.0)
Monocytes Relative: 7 % (ref 3.0–12.0)
Neutro Abs: 3.1 10*3/uL (ref 1.4–7.7)
Neutrophils Relative %: 64.5 % (ref 43.0–77.0)
Platelets: 223 10*3/uL (ref 150.0–400.0)
RBC: 4.32 Mil/uL (ref 3.87–5.11)
RDW: 14 % (ref 11.5–14.6)
WBC: 4.7 10*3/uL (ref 4.5–10.5)

## 2011-01-03 LAB — BASIC METABOLIC PANEL
BUN: 17 mg/dL (ref 6–23)
CO2: 29 mEq/L (ref 19–32)
Calcium: 9.5 mg/dL (ref 8.4–10.5)
Chloride: 103 mEq/L (ref 96–112)
Creatinine, Ser: 0.8 mg/dL (ref 0.4–1.2)
GFR: 76.58 mL/min (ref >60.00)
Glucose, Bld: 96 mg/dL (ref 70–99)
Potassium: 4.9 mEq/L (ref 3.5–5.1)
Sodium: 139 mEq/L (ref 135–145)

## 2011-01-03 LAB — PROTIME-INR
INR: 0.8 ratio (ref 0.8–1.0)
Prothrombin Time: 9.7 s — ABNORMAL LOW (ref 10.2–12.4)

## 2011-04-26 ENCOUNTER — Other Ambulatory Visit: Payer: Self-pay | Admitting: Family Medicine

## 2011-10-24 ENCOUNTER — Other Ambulatory Visit: Payer: Self-pay | Admitting: Family Medicine

## 2012-03-04 ENCOUNTER — Other Ambulatory Visit: Payer: Self-pay | Admitting: Family Medicine

## 2012-04-25 ENCOUNTER — Other Ambulatory Visit: Payer: Self-pay | Admitting: Family Medicine

## 2012-05-27 ENCOUNTER — Other Ambulatory Visit: Payer: Self-pay | Admitting: Family Medicine

## 2012-06-18 ENCOUNTER — Other Ambulatory Visit (HOSPITAL_COMMUNITY)
Admission: RE | Admit: 2012-06-18 | Discharge: 2012-06-18 | Disposition: A | Discharge status: Home / Self Care | Payer: BC Managed Care – PPO | Source: Ambulatory Visit | Attending: Family Medicine | Admitting: Family Medicine

## 2012-06-18 ENCOUNTER — Encounter: Payer: Self-pay | Admitting: Family Medicine

## 2012-06-18 ENCOUNTER — Ambulatory Visit (INDEPENDENT_AMBULATORY_CARE_PROVIDER_SITE_OTHER): Payer: BC Managed Care – PPO | Admitting: Family Medicine

## 2012-06-18 VITALS — Temp 98.1°F | Ht 63.25 in | Wt 132.5 lb

## 2012-06-18 DIAGNOSIS — R569 Unspecified convulsions: Secondary | ICD-10-CM

## 2012-06-18 DIAGNOSIS — I10 Essential (primary) hypertension: Secondary | ICD-10-CM

## 2012-06-18 DIAGNOSIS — N959 Unspecified menopausal and perimenopausal disorder: Secondary | ICD-10-CM

## 2012-06-18 DIAGNOSIS — Z01419 Encounter for gynecological examination (general) (routine) without abnormal findings: Secondary | ICD-10-CM | POA: Insufficient documentation

## 2012-06-18 DIAGNOSIS — Z23 Encounter for immunization: Secondary | ICD-10-CM

## 2012-06-18 MED ORDER — LAMOTRIGINE 100 MG PO TABS: ORAL_TABLET | ORAL | Status: DC

## 2012-06-18 MED ORDER — LISINOPRIL 5 MG PO TABS: ORAL_TABLET | ORAL | Status: DC

## 2012-06-18 MED ORDER — PHENYTOIN SODIUM EXTENDED 100 MG PO CAPS: 100.0000 mg | ORAL_CAPSULE | Freq: Two times a day (BID) | ORAL | Status: DC

## 2012-06-18 NOTE — Patient Instructions (Signed)
Restart the Lamictal and the Dilantin,,,,,,,,,,,, one of each twice daily  Return in 4 weeks for followup  Continue the lisinopril and aspirin daily  Sunscreens SPF 50+ plus plus,,,  BSE monthly  Set up her mammogram........... threed

## 2012-06-18 NOTE — Progress Notes (Signed)
  Subjective:    Patient ID: Ruth Stanley, female    DOB: [birth year], 51 y.o.   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 This was done in [place]. She did well postop and no complications. She was on antiseizure medicine for couple years and then they stopped it and she doing well until this past fall [place] She was taken to a local emergency room. She contacted her physician in [place] who recommended that she restart her anti-seizure medication.  LMP 3 years ago normal minimal postmenopausal symptoms  Routine eye care, dental care, BSE monthly, last mammogram 3 years ago, colonoscopy at age 51 normal. Tetanus 2006 seasonal flu shot today   Review of Systems  Constitutional: Negative.   HENT: Negative.   Eyes: Negative.   Respiratory: Negative.   Cardiovascular: Negative.   Gastrointestinal: Negative.   Genitourinary: Negative.   Musculoskeletal: Negative.   Neurological: Negative.   Hematological: Negative.   Psychiatric/Behavioral: Negative.        Objective:   Physical Exam  Constitutional: She appears well-developed and well-nourished.  HENT:  Head: Normocephalic and atraumatic.  Right Ear: External ear normal.  Left Ear: External ear normal.  Nose: Nose normal.  Mouth/Throat: Oropharynx is clear and moist.  Eyes: EOM are normal. Pupils are equal, round, and reactive to light.  Neck: Normal range of motion. Neck supple. No thyromegaly present.  Cardiovascular: Normal rate, regular rhythm, normal heart sounds and intact distal pulses.  Exam reveals no gallop and no friction rub.   No murmur heard. Pulmonary/Chest: Effort normal and breath sounds normal.  Abdominal: Soft. Bowel sounds are normal. She exhibits no  distension and no mass. There is no tenderness. There is no rebound.  Genitourinary: Vagina normal and uterus normal. Guaiac negative stool. No vaginal discharge found.       Bilateral breast exam normal except for dense breasts bilaterally  Musculoskeletal: Normal range of motion.  Lymphadenopathy:    She has no cervical adenopathy.  Neurological: She is alert. She has normal reflexes. No cranial nerve deficit. She exhibits normal muscle tone. Coordination normal.  Skin: Skin is warm and dry.  Psychiatric: She has a normal mood and affect. Her behavior is normal. Judgment and thought content normal.          Assessment & Plan:  Healthy female  Hypertension continue lisinopril 5 mg daily and an aspirin tablet  Status post CNS aneurysms stented x2 2006 now with recurrence of seizure disorder restart Dilantin and Lamictal followup in 4 weeks  Recommended she do BSE monthly and get and you mammography flu shot given today along with EKG

## 2012-07-09 ENCOUNTER — Other Ambulatory Visit: Payer: Self-pay | Admitting: Family Medicine

## 2012-07-09 DIAGNOSIS — Z1231 Encounter for screening mammogram for malignant neoplasm of breast: Secondary | ICD-10-CM

## 2012-07-16 ENCOUNTER — Ambulatory Visit (INDEPENDENT_AMBULATORY_CARE_PROVIDER_SITE_OTHER): Payer: BC Managed Care – PPO | Admitting: Family Medicine

## 2012-07-16 ENCOUNTER — Encounter: Payer: Self-pay | Admitting: Family Medicine

## 2012-07-16 VITALS — BP 114/70 | Temp 97.6°F | Wt 136.0 lb

## 2012-07-16 DIAGNOSIS — R569 Unspecified convulsions: Secondary | ICD-10-CM

## 2012-07-16 NOTE — Patient Instructions (Signed)
Followup with the neurologist as outlined return when necessary

## 2012-07-16 NOTE — Progress Notes (Signed)
  Subjective:    Patient ID: Ruth Stanley, female    DOB: 1960/09/15, 52 y.o.   MRN: [id number]  HPI Ruth Stanley set is a 52 year old female who comes in today for followup of his seizure disorder  CD details of her medical history in the last note. She's currently on Dilantin 200 mg daily and Lamictal 100 mg twice a day.  She was self referral to to a neurologist in [place]. They saw her yesterday. She set up for followup lab work and an EEG on the 24th.  She states she feels well no side effects from medication   Review of Systems    general review of systems otherwise negative Objective:   Physical Exam Well-developed well-nourished female no acute distress       Assessment & Plan:  Seizure disorder followup by neurology

## 2012-07-17 ENCOUNTER — Ambulatory Visit (HOSPITAL_COMMUNITY)
Admission: RE | Admit: 2012-07-17 | Discharge: 2012-07-17 | Disposition: A | Discharge status: Home / Self Care | Payer: BC Managed Care – PPO | Source: Ambulatory Visit | Attending: Family Medicine | Admitting: Family Medicine

## 2012-07-17 DIAGNOSIS — Z1231 Encounter for screening mammogram for malignant neoplasm of breast: Secondary | ICD-10-CM | POA: Insufficient documentation

## 2012-09-25 ENCOUNTER — Other Ambulatory Visit: Payer: Self-pay | Admitting: *Deleted

## 2012-09-25 DIAGNOSIS — I10 Essential (primary) hypertension: Secondary | ICD-10-CM

## 2012-09-25 MED ORDER — LISINOPRIL 5 MG PO TABS: ORAL_TABLET | ORAL | Status: DC

## 2013-03-02 ENCOUNTER — Other Ambulatory Visit: Payer: Self-pay | Admitting: Family Medicine

## 2013-05-21 ENCOUNTER — Other Ambulatory Visit: Payer: Self-pay | Admitting: Family Medicine

## 2013-09-27 ENCOUNTER — Other Ambulatory Visit: Payer: Self-pay | Admitting: Family Medicine

## 2014-01-10 ENCOUNTER — Other Ambulatory Visit (INDEPENDENT_AMBULATORY_CARE_PROVIDER_SITE_OTHER): Payer: BC Managed Care – PPO

## 2014-01-10 DIAGNOSIS — Z0000 Encounter for general adult medical examination without abnormal findings: Secondary | ICD-10-CM

## 2014-01-10 LAB — TSH: TSH: 0.58 u[IU]/mL (ref 0.35–4.50)

## 2014-01-10 LAB — LIPID PANEL
Cholesterol: 199 mg/dL (ref 0–200)
HDL: 66.5 mg/dL (ref >39.00)
LDL Cholesterol: 123 mg/dL — ABNORMAL HIGH (ref 0–99)
NonHDL: 132.5
Total CHOL/HDL Ratio: 3
Triglycerides: 49 mg/dL (ref 0.0–149.0)
VLDL: 9.8 mg/dL (ref 0.0–40.0)

## 2014-01-10 LAB — HEPATIC FUNCTION PANEL
ALT: 17 U/L (ref 0–35)
AST: 23 U/L (ref 0–37)
Albumin: 4.2 g/dL (ref 3.5–5.2)
Alkaline Phosphatase: 54 U/L (ref 39–117)
Bilirubin, Direct: 0 mg/dL (ref 0.0–0.3)
Total Bilirubin: 0.6 mg/dL (ref 0.2–1.2)
Total Protein: 7.1 g/dL (ref 6.0–8.3)

## 2014-01-10 LAB — CBC WITH DIFFERENTIAL/PLATELET
Basophils Absolute: 0 10*3/uL (ref 0.0–0.1)
Basophils Relative: 0.5 % (ref 0.0–3.0)
Eosinophils Absolute: 0.2 10*3/uL (ref 0.0–0.7)
Eosinophils Relative: 2.7 % (ref 0.0–5.0)
HCT: 39.9 % (ref 36.0–46.0)
Hemoglobin: 13.2 g/dL (ref 12.0–15.0)
Lymphocytes Relative: 23.7 % (ref 12.0–46.0)
Lymphs Abs: 1.3 10*3/uL (ref 0.7–4.0)
MCHC: 33.2 g/dL (ref 30.0–36.0)
MCV: 91 fl (ref 78.0–100.0)
Monocytes Absolute: 0.4 10*3/uL (ref 0.1–1.0)
Monocytes Relative: 6.4 % (ref 3.0–12.0)
Neutro Abs: 3.8 10*3/uL (ref 1.4–7.7)
Neutrophils Relative %: 66.7 % (ref 43.0–77.0)
Platelets: 254 10*3/uL (ref 150.0–400.0)
RBC: 4.38 Mil/uL (ref 3.87–5.11)
RDW: 13.6 % (ref 11.5–15.5)
WBC: 5.6 10*3/uL (ref 4.0–10.5)

## 2014-01-10 LAB — POCT URINALYSIS DIPSTICK
Bilirubin, UA: NEGATIVE
Blood, UA: NEGATIVE
Glucose, UA: NEGATIVE
Ketones, UA: NEGATIVE
Leukocytes, UA: NEGATIVE
Nitrite, UA: NEGATIVE
Protein, UA: NEGATIVE
Spec Grav, UA: 1.015
Urobilinogen, UA: 0.2
pH, UA: 7

## 2014-01-10 LAB — BASIC METABOLIC PANEL
BUN: 18 mg/dL (ref 6–23)
CO2: 27 mEq/L (ref 19–32)
Calcium: 10.4 mg/dL (ref 8.4–10.5)
Chloride: 106 mEq/L (ref 96–112)
Creatinine, Ser: 0.9 mg/dL (ref 0.4–1.2)
GFR: 70.78 mL/min (ref >60.00)
Glucose, Bld: 108 mg/dL — ABNORMAL HIGH (ref 70–99)
Potassium: 4.8 mEq/L (ref 3.5–5.1)
Sodium: 142 mEq/L (ref 135–145)

## 2014-01-17 ENCOUNTER — Encounter: Payer: Self-pay | Admitting: Family Medicine

## 2014-01-17 ENCOUNTER — Ambulatory Visit (INDEPENDENT_AMBULATORY_CARE_PROVIDER_SITE_OTHER): Payer: BC Managed Care – PPO | Admitting: Family Medicine

## 2014-01-17 VITALS — BP 140/90 | Temp 98.8°F | Ht 63.5 in | Wt 125.0 lb

## 2014-01-17 DIAGNOSIS — R569 Unspecified convulsions: Secondary | ICD-10-CM

## 2014-01-17 DIAGNOSIS — I10 Essential (primary) hypertension: Secondary | ICD-10-CM

## 2014-01-17 DIAGNOSIS — N959 Unspecified menopausal and perimenopausal disorder: Secondary | ICD-10-CM

## 2014-01-17 MED ORDER — LAMOTRIGINE 100 MG PO TABS: ORAL_TABLET | ORAL | Status: DC

## 2014-01-17 MED ORDER — LISINOPRIL 5 MG PO TABS: ORAL_TABLET | ORAL | Status: DC

## 2014-01-17 NOTE — Progress Notes (Signed)
Pre visit review using our clinic review tool, if applicable. No additional management support is needed unless otherwise documented below in the visit note. 

## 2014-01-17 NOTE — Progress Notes (Signed)
   Subjective:    Patient ID: Ruth Stanley, female    DOB: 28-Nov-1960, 53 y.o.   MRN: [id number]  HPI Ruth Stanley is a 53 year old married female nonsmoker who comes in today for general physical examination  In 2006 she had a aneurysm repair and had subsequently been placed on Dilantin which was then switched to Lamictal by a neurologist Dr. love and [place]. She would like to see one of our neurologists for followup. Her dose was increased to 200 mg morning and 100 mg in the evening however that dose in the morning makes her nauseated.  She also takes lisinopril 5 mg daily for blood pressure control BP today 140/90.  She gets routine eye care, dental care, BSE monthly, and you mammography is due. Check colonoscopy a couple years ago which was normal.  Tetanus booster 2006  LMP age 53 she has some menopausal symptoms with dry skin and hot flashes. Pap smear 2 years ago was normal and she's never had any trouble with Pap smears therefore recommend every 3 years. She declines any treatment for the menopausal symptoms.   Review of Systems  Constitutional: Negative.   HENT: Negative.   Eyes: Negative.   Respiratory: Negative.   Cardiovascular: Negative.   Gastrointestinal: Negative.   Genitourinary: Negative.   Musculoskeletal: Negative.   Neurological: Negative.   Psychiatric/Behavioral: Negative.        Objective:   Physical Exam  Nursing note and vitals reviewed. Constitutional: She appears well-developed and well-nourished.  HENT:  Head: Normocephalic and atraumatic.  Right Ear: External ear normal.  Left Ear: External ear normal.  Nose: Nose normal.  Mouth/Throat: Oropharynx is clear and moist.  Eyes: EOM are normal. Pupils are equal, round, and reactive to light.  Neck: Normal range of motion. Neck supple. No thyromegaly present.  Cardiovascular: Normal rate, regular rhythm, normal heart sounds and intact distal pulses.  Exam reveals no gallop and no friction rub.    No murmur heard. Pulmonary/Chest: Effort normal and breath sounds normal.  Abdominal: Soft. Bowel sounds are normal. She exhibits no distension and no mass. There is no tenderness. There is no rebound.  Genitourinary:  Bilateral breast exam normal except for thickening right breast 12:00 an area of about 4 cm x 2 cm. She's had this in the past. She had a 3-D mammogram last year because of this which was normal. Recommend she get a followup 3-D mammogram  Musculoskeletal: Normal range of motion.  Lymphadenopathy:    She has no cervical adenopathy.  Neurological: She is alert. She has normal reflexes. No cranial nerve deficit. She exhibits normal muscle tone. Coordination normal.  Skin: Skin is warm and dry.  Total body skin exam normal  Psychiatric: She has a normal mood and affect. Her behavior is normal. Judgment and thought content normal.          Assessment & Plan:  Healthy female  History of seizure disorder after CNS surgery for an aneury on Lamictal 10 mg in the morning and 100 mg in the evening ........ neuro consult for followup with one of our neurologists  Hypertension ....... continue lisinopril 5 mg daily.......... BP check every morning,,,,,,,, if blood pressure not at goal 1 3535 or less call to increase her lisinopril to 10 mg daily  Postmenopausal,,,,,,, symptomatic but declines therapy.

## 2014-01-17 NOTE — Patient Instructions (Signed)
The McDougal 100 mg,,,,,,,, one in the morning one at bedtime  Lisinopril 5 mg,,,,,,,, one tablet in the morning,,,,,,,, BP check every morning x1 week,,,,,,, BP goal 135/85 or less,,,,,,,,, if not at goal call and we will increase her lisinopril  Sunscreens SPF 50+  BSE monthly  Call and get set up for your 3-D mammogram  Return in one year sooner if any problems,

## 2014-01-18 ENCOUNTER — Telehealth: Payer: Self-pay | Admitting: Family Medicine

## 2014-01-18 ENCOUNTER — Other Ambulatory Visit: Payer: Self-pay | Admitting: Family Medicine

## 2014-01-18 DIAGNOSIS — Z1231 Encounter for screening mammogram for malignant neoplasm of breast: Secondary | ICD-10-CM

## 2014-01-18 NOTE — Telephone Encounter (Signed)
Relevant patient education mailed to patient.  

## 2014-01-19 ENCOUNTER — Ambulatory Visit (HOSPITAL_COMMUNITY)
Admission: RE | Admit: 2014-01-19 | Discharge: 2014-01-19 | Disposition: A | Discharge status: Home / Self Care | Payer: BC Managed Care – PPO | Source: Ambulatory Visit | Attending: Family Medicine | Admitting: Family Medicine

## 2014-01-19 DIAGNOSIS — Z1231 Encounter for screening mammogram for malignant neoplasm of breast: Secondary | ICD-10-CM | POA: Insufficient documentation

## 2014-01-27 ENCOUNTER — Encounter: Payer: Self-pay | Admitting: Neurology

## 2014-01-27 ENCOUNTER — Ambulatory Visit (INDEPENDENT_AMBULATORY_CARE_PROVIDER_SITE_OTHER): Payer: BC Managed Care – PPO | Admitting: Neurology

## 2014-01-27 VITALS — BP 130/80 | HR 64 | Resp 16 | Ht 64.0 in | Wt 124.6 lb

## 2014-01-27 DIAGNOSIS — R569 Unspecified convulsions: Secondary | ICD-10-CM

## 2014-01-27 DIAGNOSIS — I671 Cerebral aneurysm, nonruptured: Secondary | ICD-10-CM

## 2014-01-27 NOTE — Progress Notes (Addendum)
NEUROLOGY CONSULTATION NOTE  Ruth Stanley MRN: [id number] DOB: [birth year]  Referring provider: Dr. Stevie Kern Primary care provider: Dr. Stevie Kern  Reason for consult:  Establish care for seizure history  Dear Dr Sherren Mocha:  Thank you for your kind referral of Ruth Stanley for consultation of the above symptoms. Although her history is well known to you, please allow me to reiterate it for the purpose of our medical record. Records and images were personally reviewed where available.  HISTORY OF PRESENT ILLNESS: This is a pleasant 53 year old right-handed woman with a history of hypertension, right MCA aneurysm s/p coiling in 2006, and one episode of unwitnessed seizure in 2008.  Records were reviewed from 2008, she was at home working, recalls standing up then falling over her chair, then the next thing she knew she had somehow gotten herself to her bed.  Per notes, she had messages on her phone that were unanswered, she woke up face down on the bed with lacerations on her face, arms, and legs, and she found the dining room chair had been knocked into the wall making a hole in the dry wall.  She had no recollection of events for 3 hours.  She had a tongue bite, no urinary incontinence.  She was initially started on Dilantin, then switched to Lamictal by neurologist Dr. Doy Mince. Neurology records unavailable for review.  She reports that she was found to have bilateral aneurysms in 2006 after she had a severe headache.  She continues to follow-up with neurointerventionalist Dr. Zigmund Gottron in [place], [place] for follow-up scans.  Her next scheduled scan is in another 5 years. Per Dr. Honor Junes notes, she had a stent to the right MCA and was on prophylactic Dilantin until December 2007.  She denies any olfactory/gustatory hallucinations, deja vu, rising epigastric sensation, focal numbness/tingling/weakness, myoclonic jerks. She denies any further headaches, no dizziness, diplopia,  dysarthria, dysphagia, neck/back pain, bowel/bladder dysfunction.  She reports taking Lamictal 248m in AM, 1062min PM for several years, however over the past year she started having nausea and in the morning and felt this to be due to the Lamictal.  She spoke to Dr. ToSherren Mochand dose was reduced last week to 10063mID with no futher nausea.  She does report heavy coffee drinking and has reduced this as well to 1 cup/day.  Epilepsy Risk Factors:  Right MCA coil/stent, patient reports having bilateral coils. Records requested for review.  Otherwise she had a normal birth and early development.  There is no history of febrile convulsions, CNS infections such as meningitis/encephalitis, significant traumatic brain injury, neurosurgical procedures, or family history of seizures.  Prior AEDs: Dilantin Laboratory Data:  Lab Results  Component Value Date   WBC 5.6 01/10/2014   HGB 13.2 01/10/2014   HCT 39.9 01/10/2014   MCV 91.0 01/10/2014   PLT 254.0 01/10/2014     Chemistry      Component Value Date/Time   NA 142 01/10/2014 0925   K 4.8 01/10/2014 0925   CL 106 01/10/2014 0925   CO2 27 01/10/2014 0925   BUN 18 01/10/2014 0925   CREATININE 0.9 01/10/2014 0925      Component Value Date/Time   CALCIUM 10.4 01/10/2014 0925   ALKPHOS 54 01/10/2014 0925   AST 23 01/10/2014 0925   ALT 17 01/10/2014 0925   BILITOT 0.6 01/10/2014 0925     No EEGs or MRIs available for review at this time.  PAST MEDICAL HISTORY: History reviewed. Hypertension  PAST SURGICAL HISTORY: Past Surgical History  Procedure Laterality Date  . Cerebral aneurysm repair  2006    MEDICATIONS: Current Outpatient Prescriptions on File Prior to Visit  Medication Sig Dispense Refill  . aspirin 325 MG tablet Take 325 mg by mouth daily.      . calcium carbonate 200 MG capsule Take 250 mg by mouth 2 (two) times daily with a meal.      . lamoTRIgine (LAMICTAL) 100 MG tablet 1 tabs am and 1 tab pm  200 tablet  3  . lisinopril (PRINIVIL,ZESTRIL) 5 MG  tablet TAKE 1 TABLET EVERY MORNING (NEEDS OFFICE VISIT FOR REFILLS)  100 tablet  3  . Multiple Vitamins-Minerals (CENTRUM SILVER PO) Take by mouth.       No current facility-administered medications on file prior to visit.    ALLERGIES: No Known Allergies  FAMILY HISTORY: No family history on file.  SOCIAL HISTORY: History   Social History  . Marital Status: Married    Spouse Name: N/A    Number of Children: N/A  . Years of Education: N/A   Occupational History  . Not on file.   Social History Main Topics  . Smoking status: Former Research scientist (life sciences)  . Smokeless tobacco: Not on file  . Alcohol Use:   . Drug Use:   . Sexual Activity:    Other Topics Concern  . Not on file   Social History Narrative  . No narrative on file    REVIEW OF SYSTEMS: Constitutional: No fevers, chills, or sweats, no generalized fatigue, change in appetite Eyes: No visual changes, double vision, eye pain Ear, nose and throat: No hearing loss, ear pain, nasal congestion, sore throat Cardiovascular: No chest pain, palpitations Respiratory:  No shortness of breath at rest or with exertion, wheezes GastrointestinaI: No nausea, vomiting, diarrhea, abdominal pain, fecal incontinence Genitourinary:  No dysuria, urinary retention or frequency Musculoskeletal:  No neck pain, back pain Integumentary: No rash, pruritus, skin lesions Neurological: as above Psychiatric: No depression, insomnia, anxiety Endocrine: No palpitations, fatigue, diaphoresis, mood swings, change in appetite, change in weight, increased thirst Hematologic/Lymphatic:  No anemia, purpura, petechiae. Allergic/Immunologic: no itchy/runny eyes, nasal congestion, recent allergic reactions, rashes  PHYSICAL EXAM: Filed Vitals:   01/27/14 0828  BP: 130/80  Pulse: 64  Resp: 16   General: No acute distress Head:  Normocephalic/atraumatic Eyes: Fundoscopic exam shows bilateral sharp discs, no vessel changes, exudates, or hemorrhages Neck:  supple, no paraspinal tenderness, full range of motion Back: No paraspinal tenderness Heart: regular rate and rhythm Lungs: Clear to auscultation bilaterally. Vascular: No carotid bruits. Skin/Extremities: No rash, no edema Neurological Exam: Mental status: alert and oriented to person, place, and time, no dysarthria or aphasia, Fund of knowledge is appropriate.  Recent and remote memory are intact.  Attention and concentration are normal.    Able to name objects and repeat phrases. Cranial nerves: CN I: not tested CN II: pupils equal, round and reactive to light, visual fields intact, fundi unremarkable. CN III, IV, VI:  full range of motion, no nystagmus, no ptosis CN V: facial sensation intact CN VII: upper and lower face symmetric CN VIII: hearing intact to finger rub CN IX, X: gag intact, uvula midline CN XI: sternocleidomastoid and trapezius muscles intact CN XII: tongue midline Bulk & Tone: normal, no fasciculations. Motor: 5/5 throughout with no pronator drift. Sensation: intact to light touch, cold, pin, vibration and joint position sense.  No extinction to double simultaneous stimulation.  Romberg test negative Deep  Tendon Reflexes: brisk +2 throughout, no ankle clonus, negative Hoffman's sign Plantar responses: downgoing bilaterally Cerebellar: no incoordination on finger to nose, heel to shin. No dysdiadochokinesia Gait: narrow-based and steady, able to tandem walk adequately. Tremor: none  IMPRESSION: This is a pleasant 53 year old right-handed woman with a history of hypertension, right MCA aneurysm coiling, per patient she has bilateral coils, records unavailable for review, and an episode suggestive of seizure in 2008. No further similar seizures or seizure-like symptoms since then.  She requested to reduce Lamictal dose to 151m BID due to nausea, however this may relate to caffeine intake.  She reports nausea resolved on lower dose Lamictal and decreasing caffeine  intake.  A baseline Lamictal level will be done. Continue on current dose Lamictal 1032mBID for now.  Records from Dr. PeZigmund Gottronnd ReDoy Minceill be requested for review.  [place] driving laws were discussed with the patient, and she knows to stop driving after a seizure, until 6 months seizure-free. She will follow-up annually and knows to call our office for any problems in the interim.  Thank you for allowing me to participate in the care of this patient. Please do not hesitate to call for any questions or concerns.   KaEllouise NewerM.D.  CC: Dr. ToSherren Mocha 03/07/2014 Addendum: Records from [facility].  She had a subarachnoid hemorrhage in 2006, and underwent coil embolization of 2 intracranial aneurysms, on in the right posterior communicating artery, another in the left posterior communicating artery. 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  She underwent angioplasty and stenting. 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  She saw neurologist Dr. LuGayla Medicusn January 2014. Records reviewed.  In 2008, she had the episode noted above, and was started on Dilantin which she took for 2 years then tapered off due to perception she was doing well. On 06/11/2012, she was walking her dog and stopped to talk to a neighbor. Her husband noted her eyes rolled back and speech became very slurred and nonsensical.  There is a hospital note where she seemed paranoid, ran into the house, locked the door and would not answer until her husband convinced her.  She was admitted overnight with unremarkable MRI/MRA.  She was back to baseline in an hour.  She was restarted on Lamictal and Dilantin at that time.  There is note that if Lamictal level is therapeutic, Dilantin will be tapered. She is currently on Lamictal 100104mID.  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 No acuten changes.  MRA head without contrast 06/11/12: Bilateral GCD coils in right M1 segment and left supraclinoid ICA without definite evidence of underlying aneurysm. EEG 05/18/2007 normal awake, drowsy, light sleep states.

## 2014-01-27 NOTE — Patient Instructions (Signed)
1. Continue Lamictal 138m twice a day 2. Bloodwork for Lamictal level (have blood drawn before you take your morning dose) 3. Follow-up in 1 year

## 2014-01-30 LAB — LAMOTRIGINE LEVEL: Lamotrigine Lvl: 9.5 ug/mL (ref 4.0–18.0)

## 2014-06-20 ENCOUNTER — Ambulatory Visit (INDEPENDENT_AMBULATORY_CARE_PROVIDER_SITE_OTHER): Payer: BC Managed Care – PPO | Admitting: Family Medicine

## 2014-06-20 ENCOUNTER — Encounter: Payer: Self-pay | Admitting: Family Medicine

## 2014-06-20 VITALS — BP 130/88 | Temp 97.9°F | Wt 128.0 lb

## 2014-06-20 DIAGNOSIS — K625 Hemorrhage of anus and rectum: Secondary | ICD-10-CM

## 2014-06-20 DIAGNOSIS — K5909 Other constipation: Secondary | ICD-10-CM

## 2014-06-20 DIAGNOSIS — K5900 Constipation, unspecified: Secondary | ICD-10-CM

## 2014-06-20 MED ORDER — STARCH 51 % RE SUPP: RECTAL | Status: DC

## 2014-06-20 NOTE — Progress Notes (Signed)
Pre visit review using our clinic review tool, if applicable. No additional management support is needed unless otherwise documented below in the visit note. 

## 2014-06-20 NOTE — Patient Instructions (Signed)
Drink lots of water  Anusol suppositories,,,,,,,, 1 rectally at bedtime for 3 weeks  Fruit daily  Milk of magnesia 2 tablespoons twice daily Metamucil 2 tablespoons twice daily  MiraLAX when necessary if you don't have a bowel movement once or twice daily  Return in 6 weeks for follow-up

## 2014-06-20 NOTE — Progress Notes (Signed)
   Subjective:    Patient ID: Ruth Stanley, female    DOB: 1960-09-02, 53 y.o.   MRN: [id number]  HPI Ruth Stanley is a 53 year old married female nonsmoker who has a history of chronic constipation for many many years who about 2 weeks ago had to strain and had some bright red rectal bleeding. Since that time the bleeding has persisted it's been painless. She had a colonoscopy 5 years ago which was normal. No family history of colon cancer polyps   Review of Systems Review of systems otherwise negative    Objective:   Physical Exam Well-developed well-nourished female no acute distress vital signs stable she's afebrile examination rectum is normal. Internal exam shows no palpable masses stool was mixed with bright blood       Assessment & Plan:  Chronic constipation with internal hemorrhoidal bleeding..........Marland Kitchen program to relieve the constipation and stopped the bleeding as outlined

## 2014-08-01 ENCOUNTER — Ambulatory Visit: Payer: BC Managed Care – PPO | Admitting: Family Medicine

## 2014-10-17 ENCOUNTER — Encounter: Payer: Self-pay | Admitting: Gastroenterology

## 2014-12-18 ENCOUNTER — Other Ambulatory Visit: Payer: Self-pay | Admitting: Family Medicine

## 2015-04-10 ENCOUNTER — Other Ambulatory Visit: Payer: Self-pay

## 2015-04-10 DIAGNOSIS — Z1231 Encounter for screening mammogram for malignant neoplasm of breast: Secondary | ICD-10-CM

## 2015-04-13 ENCOUNTER — Ambulatory Visit
Admission: RE | Admit: 2015-04-13 | Discharge: 2015-04-13 | Disposition: A | Discharge status: Home / Self Care | Payer: BLUE CROSS/BLUE SHIELD | Source: Ambulatory Visit

## 2015-04-13 DIAGNOSIS — Z1231 Encounter for screening mammogram for malignant neoplasm of breast: Secondary | ICD-10-CM

## 2015-06-12 ENCOUNTER — Encounter: Payer: Self-pay | Admitting: Gastroenterology

## 2015-08-14 ENCOUNTER — Ambulatory Visit (AMBULATORY_SURGERY_CENTER): Payer: Self-pay | Admitting: *Deleted

## 2015-08-14 VITALS — Ht 62.0 in | Wt 128.4 lb

## 2015-08-14 DIAGNOSIS — Z86010 Personal history of colonic polyps: Secondary | ICD-10-CM

## 2015-08-14 MED ORDER — SUPREP BOWEL PREP KIT 17.5-3.13-1.6 GM/177ML PO SOLN: 1.0000 | Freq: Once | ORAL | Status: DC

## 2015-08-14 NOTE — Progress Notes (Signed)
Patient denies any allergies to egg or soy products. Patient denies complications with anesthesia/sedation.  Patient denies oxygen use at home and denies diet medications. Emmi instructions for colonoscopy but patient denied.

## 2015-08-28 ENCOUNTER — Ambulatory Visit (AMBULATORY_SURGERY_CENTER): Payer: BLUE CROSS/BLUE SHIELD | Admitting: Gastroenterology

## 2015-08-28 ENCOUNTER — Encounter: Payer: Self-pay | Admitting: Gastroenterology

## 2015-08-28 VITALS — BP 112/58 | HR 80 | Temp 99.0°F | Resp 13 | Ht 62.0 in | Wt 128.0 lb

## 2015-08-28 DIAGNOSIS — Z86010 Personal history of colonic polyps: Secondary | ICD-10-CM

## 2015-08-28 DIAGNOSIS — K639 Disease of intestine, unspecified: Secondary | ICD-10-CM

## 2015-08-28 DIAGNOSIS — K5150 Left sided colitis without complications: Secondary | ICD-10-CM | POA: Diagnosis not present

## 2015-08-28 DIAGNOSIS — K625 Hemorrhage of anus and rectum: Secondary | ICD-10-CM

## 2015-08-28 MED ORDER — SODIUM CHLORIDE 0.9 % IV SOLN: 500.0000 mL | INTRAVENOUS | Status: DC

## 2015-08-28 NOTE — Patient Instructions (Addendum)
YOU HAD AN ENDOSCOPIC PROCEDURE TODAY AT [facility]:   Refer to the procedure report that was given to you for any specific questions about what was found during the examination.  If the procedure report does not answer your questions, please call your gastroenterologist to clarify.  If you requested that your care partner not be given the details of your procedure findings, then the procedure report has been included in a sealed envelope for you to review at your convenience later.  YOU SHOULD EXPECT: Some feelings of bloating in the abdomen. Passage of more gas than usual.  Walking can help get rid of the air that was put into your GI tract during the procedure and reduce the bloating. If you had a lower endoscopy (such as a colonoscopy or flexible sigmoidoscopy) you may notice spotting of blood in your stool or on the toilet paper. If you underwent a bowel prep for your procedure, you may not have a normal bowel movement for a few days.  Please Note:  You might notice some irritation and congestion in your nose or some drainage.  This is from the oxygen used during your procedure.  There is no need for concern and it should clear up in a day or so.  SYMPTOMS TO REPORT IMMEDIATELY:   Following lower endoscopy (colonoscopy or flexible sigmoidoscopy):  Excessive amounts of blood in the stool  Significant tenderness or worsening of abdominal pains  Swelling of the abdomen that is new, acute  Fever of 100F or higher  For urgent or emergent issues, a gastroenterologist can be reached at any hour by calling [phone].   DIET: Your first meal following the procedure should be a small meal and then it is ok to progress to your normal diet. Heavy or fried foods are harder to digest and may make you feel nauseous or bloated.  Likewise, meals heavy in dairy and vegetables can increase bloating.  Drink plenty of fluids but you should avoid alcoholic beverages for 24  hours.  ACTIVITY:  You should plan to take it easy for the rest of today and you should NOT DRIVE or use heavy machinery until tomorrow (because of the sedation medicines used during the test).    FOLLOW UP: Our staff will call the number listed on your records the next business day following your procedure to check on you and address any questions or concerns that you may have regarding the information given to you following your procedure. If we do not reach you, we will leave a message.  However, if you are feeling well and you are not experiencing any problems, there is no need to return our call.  We will assume that you have returned to your regular daily activities without incident.  If any biopsies were taken you will be contacted by phone or by letter within the next 1-3 weeks.  Please call us at [phone] if you have not heard about the biopsies in 3 weeks.    SIGNATURES/CONFIDENTIALITY: You and/or your care partner have signed paperwork which will be entered into your electronic medical record.  These signatures attest to the fact that that the information above on your After Visit Summary has been reviewed and is understood.  Full responsibility of the confidentiality of this discharge information lies with you and/or your care-partner.  Please read all your handouts given by your recovery RN. Thank you for letting us take care of your healthcare needs.

## 2015-08-28 NOTE — Progress Notes (Signed)
Report to PACU, RN, vss, BBS= Clear.  

## 2015-08-28 NOTE — Op Note (Signed)
[place]  [address] [place], [id number]   COLONOSCOPY PROCEDURE REPORT  PATIENT: Ruth Stanley, Ruth Stanley  MR#: [id number] BIRTHDATE: 17-Sep-1960 , 39  yrs. old GENDER: female ENDOSCOPIST: Milus Banister, MD PROCEDURE DATE:  08/28/2015 PROCEDURE:   Colonoscopy, surveillance and Colonoscopy with biopsy First Screening Colonoscopy - Avg.  risk and is 50 yrs.  old or older - No.  Prior Negative Screening - Now for repeat screening. N/A  History of Adenoma - Now for follow-up colonoscopy & has been > or = to 3 yrs.  N/A  Recommend repeat exam, <10 yrs? No ASA CLASS:   Class II INDICATIONS:Colonoscopy 2011 one 70m polyp removed, not retrieved; also she reported blood with BMs about twice per month. MEDICATIONS: Monitored anesthesia care and Propofol 220 mg IV  DESCRIPTION OF PROCEDURE:   After the risks benefits and alternatives of the procedure were thoroughly explained, informed consent was obtained.  The digital rectal exam revealed no abnormalities of the rectum.   The LB [id number] endoscope was introduced through the anus and advanced to the terminal ileum which was intubated for a short distance. No adverse events experienced.   The quality of the prep was excellent.  The instrument was then slowly withdrawn as the colon was fully examined. Estimated blood loss is zero unless otherwise noted in this procedure report.   COLON FINDINGS: There was moderate to severe inflammation in the left colon, this extended from the anus, confluently to about the splenic flexure where there was a fairly distinct transition to normal appearing mucosa proximally.  The terminal ileum was normal. The right colon was randomly biopsied (appeared normal endoscopically).  The left colon inflammation was randomly biopsied.  There were no polyps.  The examination was otherwise normal.  Retroflexed views revealed no abnormalities. The time to cecum = 3.2 Withdrawal time = 7.3    The scope was withdrawn and the procedure completed. COMPLICATIONS: There were no immediate complications.  ENDOSCOPIC IMPRESSION: There was moderate to severe inflammation in the left colon, this extended from the anus, confluently to about the splenic flexure where there was a fairly distinct transition to normal appearing mucosa proximally.  The terminal ileum was normal.  The right colon was randomly biopsied (appeared normal endoscopically).  The left colon inflammation was randomly biopsied.  There were no polyps. The examination was otherwise normal  RECOMMENDATIONS: 1.  You should continue to follow colorectal cancer screening guidelines for "routine risk" patients with a repeat colonoscopy in 10 years.  There is no need for FOBT (stool) testing for at least 5 years. 2.  Await biopsy results (will likely start you on either mesalamine or steroids pending the results)  eSigned:  DMilus Banister MD 08/28/2015 7:44 AM   cc: JStevie Kern MD

## 2015-08-28 NOTE — Progress Notes (Signed)
Called to room to assist during endoscopic procedure.  Patient ID and intended procedure confirmed with present staff. Received instructions for my participation in the procedure from the performing physician.  

## 2015-08-29 ENCOUNTER — Telehealth: Payer: Self-pay

## 2015-08-29 NOTE — Telephone Encounter (Signed)
  Follow up Call-  Call back number 08/28/2015  Post procedure Call Back phone  # [phone]  Permission to leave phone message Yes     Patient questions:  Do you have a fever, pain , or abdominal swelling? No. Pain Score  0 *  Have you tolerated food without any problems? Yes.    Have you been able to return to your normal activities? Yes.    Do you have any questions about your discharge instructions: Diet   No. Medications  No. Follow up visit  No.  Do you have questions or concerns about your Care? No.  Actions: * If pain score is 4 or above: No action needed, pain <4.  No problems per the pt. maw

## 2015-09-12 ENCOUNTER — Other Ambulatory Visit: Payer: Self-pay | Admitting: *Deleted

## 2015-09-12 MED ORDER — MESALAMINE 1.2 G PO TBEC: DELAYED_RELEASE_TABLET | ORAL | Status: DC

## 2015-09-14 ENCOUNTER — Other Ambulatory Visit: Payer: Self-pay | Admitting: Family Medicine

## 2015-09-26 ENCOUNTER — Encounter: Payer: Self-pay | Admitting: Gastroenterology

## 2015-11-14 ENCOUNTER — Ambulatory Visit (INDEPENDENT_AMBULATORY_CARE_PROVIDER_SITE_OTHER): Payer: BLUE CROSS/BLUE SHIELD | Admitting: Gastroenterology

## 2015-11-14 ENCOUNTER — Encounter: Payer: Self-pay | Admitting: Gastroenterology

## 2015-11-14 VITALS — BP 130/86 | HR 80 | Ht 63.75 in | Wt 126.0 lb

## 2015-11-14 DIAGNOSIS — K5180 Other ulcerative colitis without complications: Secondary | ICD-10-CM

## 2015-11-14 NOTE — Progress Notes (Signed)
Review of pertinent gastrointestinal problems: 1. Left sided ulcerative colitis. Presented with bloody diarrhea (for months) Colonoscopy Dr. Ardis Hughs February 2017 for rectal bleeding found moderate inflammation from the rectum to the splenic flexure. The remaining colon and terminal ileum was all normal. Biopsies from right colon were normal. Biopsies from left colon showed acute, chronic changes consistent with ulcerative colitis. She was told her on full dose mesalamine orally.  However she only took 1 month of it.   HPI: This is a  very pleasant 55 year old woman whom I last saw about 3 months ago the time of a colonoscopy.  Chief complaint is ulcerative colitis  She noticed improvement within 2-3 days after starting the mesalamine. Bleeding resolved.  Diarrhea resolved.  Currently has 1-2 Solid brown BMs daily.  No rashes, lumps on skin, no eye issues.  She took one month of it. For some reason there were no refills called in, this was probably my air. She assumed she was only supposed to be on for one month. Since then, in the past 2 months, she has continued to feel perfectly normal without bleeding, diarrhea, urgency, abdominal pains.  Non-smoker.  Overall stable weight.   Past Medical History  Diagnosis Date  . Hypertension   . Seizures ([place])     last one 06/11/2012   . Constipation     Past Surgical History  Procedure Laterality Date  . Cerebral aneurysm repair  2006  . Wisdom tooth extraction    . Colonoscopy  07/2009    Ardis Hughs - Hx polyp    Current Outpatient Prescriptions  Medication Sig Dispense Refill  . aspirin 325 MG tablet Take 325 mg by mouth daily.    . calcium carbonate 200 MG capsule Take 250 mg by mouth 2 (two) times daily with a meal.    . lamoTRIgine (LAMICTAL) 100 MG tablet TAKE 1 TABLET IN THE MORNING AND 1 TABLET IN THE EVENING 200 tablet 2  . lisinopril (PRINIVIL,ZESTRIL) 5 MG tablet TAKE 1 TABLET EVERY MORNING (NEED OFFICE VISIT FOR REFILLS) 100 tablet 2   . Multiple Vitamins-Minerals (CENTRUM SILVER PO) Take by mouth.     No current facility-administered medications for this visit.    Allergies as of 11/14/2015  . (No Known Allergies)    Family History  Problem Relation Age of Onset  . Colon cancer Neg Hx   . Colon polyps Neg Hx   . Esophageal cancer Neg Hx   . Rectal cancer Neg Hx   . Stomach cancer Neg Hx     Social History   Social History  . Marital Status: Married    Spouse Name: N/A  . Number of Children: N/A  . Years of Education: N/A   Occupational History  . Not on file.   Social History Main Topics  . Smoking status: Former Smoker -- 0.25 packs/day    Types: Cigarettes    Quit date: 10/15/2004  . Smokeless tobacco: Never Used  . Alcohol Use: 0.0 oz/week    0 Standard drinks or equivalent per week     Comment: occasional wine  . Drug Use: No  . Sexual Activity: Yes    Birth Control/ Protection: Post-menopausal   Other Topics Concern  . Not on file   Social History Narrative     Physical Exam: BP 130/86 mmHg  Pulse 80  Ht 5' 3.75" (1.619 m)  Wt 126 lb (57.153 kg)  BMI 21.80 kg/m2  LMP 06/18/2010 Constitutional: generally well-appearing Psychiatric: alert and oriented x3  Abdomen: soft, nontender, nondistended, no obvious ascites, no peritoneal signs, normal bowel sounds   Assessment and plan: 55 y.o. female with Left-sided colitis  I explained to her that my plan was that she be on oral mesalamine for longer than just 1 month. It was an air, probably on my part, did not have called in refills for her. She however responded very well to the mesalamine after just 3 or 4 days. She completed one month. Her symptoms have not returned. She feels absolutely fine now. I explained her we have 2 options. She could restart her mesalamine orally as well as my original plan now. Alternatively we can wait to see if she has return of symptoms and she will call at first sign of symptoms and restart mesalamine at  that point. She prefers the latter approach. She will call if she has a return of colitis like symptoms and I would likely put her back on full strength oral mesalamine.   Owens Loffler, MD [facility] 11/14/2015, 8:48 AM

## 2015-11-14 NOTE — Patient Instructions (Signed)
Will restart you on lialda 4 pills once daily IF/WHEN you have diarrhea, bleeding again.  Call earlier rather than later. Avoid NSAIDs as best as possibly, they can cause a flare of colitis.

## 2015-12-13 ENCOUNTER — Ambulatory Visit (INDEPENDENT_AMBULATORY_CARE_PROVIDER_SITE_OTHER): Payer: BLUE CROSS/BLUE SHIELD | Admitting: Family Medicine

## 2015-12-13 ENCOUNTER — Encounter: Payer: Self-pay | Admitting: Family Medicine

## 2015-12-13 VITALS — BP 160/80 | HR 78 | Temp 98.0°F | Wt 126.4 lb

## 2015-12-13 DIAGNOSIS — J039 Acute tonsillitis, unspecified: Secondary | ICD-10-CM | POA: Diagnosis not present

## 2015-12-13 MED ORDER — METHYLPREDNISOLONE 4 MG PO TBPK: ORAL_TABLET | ORAL | Status: DC

## 2015-12-13 MED ORDER — AMOXICILLIN-POT CLAVULANATE 875-125 MG PO TABS: 1.0000 | ORAL_TABLET | Freq: Two times a day (BID) | ORAL | Status: DC

## 2015-12-13 MED ORDER — LISINOPRIL 10 MG PO TABS: 10.0000 mg | ORAL_TABLET | Freq: Every day | ORAL | Status: DC

## 2015-12-13 NOTE — Progress Notes (Signed)
   Subjective:    Patient ID: Ruth Stanley, female    DOB: [birth year], 55 y.o.   MRN: [id number]  HPI Here for 4 days of pain in the right side of the throat, the right anterior neck, and the right ear. No cough or headache. No fever. Advil has not helped.    Review of Systems  Constitutional: Negative.   HENT: Positive for ear pain, sore throat and trouble swallowing. Negative for congestion, mouth sores, postnasal drip, sinus pressure and voice change.   Eyes: Negative.   Respiratory: Negative.   Neurological: Negative.        Objective:   Physical Exam  Constitutional: She is oriented to person, place, and time. She appears well-developed and well-nourished.  HENT:  Right Ear: External ear normal.  Left Ear: External ear normal.  Nose: Nose normal.  Mouth/Throat: No oropharyngeal exudate.  The right tonsil is red and swollen   Eyes: Conjunctivae are normal.  Neck: Neck supple.  Tender slightly enlarged right AC nodes  Pulmonary/Chest: Effort normal and breath sounds normal.  Neurological: She is alert and oriented to person, place, and time. No cranial nerve deficit.          Assessment & Plan:  Tonsillitis, treat with Augmentin and a Medrol dose pack. Drink fluids.  Laurey Morale, MD

## 2016-01-24 ENCOUNTER — Telehealth: Payer: Self-pay | Admitting: Family Medicine

## 2016-01-24 NOTE — Telephone Encounter (Signed)
The neuro radiology interventional dept at [facility] has asked pt to   get a referral for  MRA of the brain with GAD  Pt received a letter and advised ok to get from primary.  (due to 2 past aneurisms)  does pt need to make an appointment to see someone? She would prefer [facility].

## 2016-01-24 NOTE — Telephone Encounter (Signed)
[name] - Could you give any guidance on this?

## 2016-01-25 NOTE — Telephone Encounter (Signed)
Left message for pt to call back  °

## 2016-01-25 NOTE — Telephone Encounter (Signed)
Yeah she should be seen before that test is ordered

## 2016-01-25 NOTE — Telephone Encounter (Signed)
Could you please schedule her? Thanks!

## 2016-01-29 NOTE — Telephone Encounter (Signed)
Pt has been scheduled.  °

## 2016-01-30 ENCOUNTER — Ambulatory Visit (INDEPENDENT_AMBULATORY_CARE_PROVIDER_SITE_OTHER): Payer: BLUE CROSS/BLUE SHIELD | Admitting: Adult Health

## 2016-01-30 ENCOUNTER — Encounter: Payer: Self-pay | Admitting: Adult Health

## 2016-01-30 VITALS — BP 132/70 | Temp 98.1°F | Wt 124.0 lb

## 2016-01-30 DIAGNOSIS — I729 Aneurysm of unspecified site: Secondary | ICD-10-CM | POA: Diagnosis not present

## 2016-01-30 LAB — BASIC METABOLIC PANEL
BUN: 18 mg/dL (ref 6–23)
CO2: 30 mEq/L (ref 19–32)
Calcium: 9.9 mg/dL (ref 8.4–10.5)
Chloride: 104 mEq/L (ref 96–112)
Creatinine, Ser: 0.82 mg/dL (ref 0.40–1.20)
GFR: 76.22 mL/min (ref >60.00)
Glucose, Bld: 89 mg/dL (ref 70–99)
Potassium: 4.3 mEq/L (ref 3.5–5.1)
Sodium: 139 mEq/L (ref 135–145)

## 2016-01-30 NOTE — Patient Instructions (Addendum)
It was great meeting you today.   Please go to the lab and get blood work. Email the paperwork that Neuroradiology sent you.   I will follow up with you regarding your blood work and someone will call you to schedule the MRA  Please let me know if you need anything.

## 2016-01-30 NOTE — Progress Notes (Signed)
Subjective:    Patient ID: Ruth Stanley, female    DOB: 10-29-1960, 55 y.o.   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 She sees neuro radiology at [facility] s/p two brain aneurysms. She reports that "everything is fine, they just like to make sure."   She denies any headaches or blurred vision.     Review of Systems  Constitutional: Negative.   Eyes: Negative.   Neurological: Negative.   All other systems reviewed and are negative.  Past Medical History:  Diagnosis Date  . Constipation   . Hypertension   . Seizures ([place])    last one 06/11/2012     Social History   Social History  . Marital status: Married    Spouse name: N/A  . Number of children: N/A  . Years of education: N/A   Occupational History  . Not on file.   Social History Main Topics  . Smoking status: Former Smoker    Packs/day: 0.25    Types: Cigarettes    Quit date: 10/15/2004  . Smokeless tobacco: Never Used  . Alcohol use 0.0 oz/week     Comment: occasional wine  . Drug use: No  . Sexual activity: Yes    Birth control/ protection: Post-menopausal   Other Topics Concern  . Not on file   Social History Narrative  . No narrative on file    Past Surgical History:  Procedure Laterality Date  . CEREBRAL ANEURYSM REPAIR  2006  . COLONOSCOPY  07/2009   Ardis Hughs - Hx polyp  . WISDOM TOOTH EXTRACTION      Family History  Problem Relation Age of Onset  . Colon cancer Neg Hx   . Colon polyps Neg Hx   . Esophageal cancer Neg Hx   . Rectal cancer Neg Hx   . Stomach cancer Neg Hx     No Known Allergies  Current Outpatient Prescriptions on File Prior to Visit  Medication Sig Dispense Refill  . aspirin 325 MG tablet Take 325 mg by mouth daily.    . calcium carbonate 200 MG capsule Take 250 mg by mouth 2 (two) times daily with a meal.    . lamoTRIgine (LAMICTAL) 100 MG tablet TAKE 1 TABLET IN  THE MORNING AND 1 TABLET IN THE EVENING 200 tablet 2  . lisinopril (PRINIVIL,ZESTRIL) 10 MG tablet Take 1 tablet (10 mg total) by mouth daily. 90 tablet 3  . Multiple Vitamins-Minerals (CENTRUM SILVER PO) Take by mouth.     No current facility-administered medications on file prior to visit.     BP 132/70 (BP Location: Left Arm, Patient Position: Sitting)   Temp 98.1 F (36.7 C) (Oral)   Wt 124 lb (56.2 kg)   LMP 06/18/2010   BMI 21.45 kg/m       Objective:   Physical Exam  Constitutional: She is oriented to person, place, and time. She appears well-developed and well-nourished. No distress.  Cardiovascular: Normal rate, regular rhythm, normal heart sounds and intact distal pulses.  Exam reveals no gallop and no friction rub.   No murmur heard. Pulmonary/Chest: Effort normal and breath sounds normal.  Neurological: She is alert and oriented to person, place, and time.  Skin: Skin is warm and dry. No rash noted. She is not diaphoretic. No erythema. No pallor.  Psychiatric: She has a normal mood and affect. Her behavior is  normal. Judgment and thought content normal.  Nursing note and vitals reviewed.     Assessment & Plan:  1. Aneurysm ([place]) - Basic metabolic panel - MR MRA HEAD W CONTRAST; Future - Will fax results to her team at [facility], NP

## 2016-02-12 ENCOUNTER — Telehealth: Payer: Self-pay | Admitting: Family Medicine

## 2016-02-12 NOTE — Telephone Encounter (Signed)
Ruth Stanley with [facility] states she has a question about pt's MRI. She states you do not need contrast in order to see the veins, they have something special that will allow them to see the veins without exposing pt to the contrast.  Would like you to change the order to without contrast, unless there is a special reason pt needs.  Pt has appointment 9:15 am on Tues, 8/8

## 2016-02-12 NOTE — Telephone Encounter (Signed)
I called Ruth Stanley and she was at lunch.  I informed [name] to let Ruth Stanley know that [name] is out of the office today and this message will be addressed on Tuesday when he returns to the office.

## 2016-02-13 ENCOUNTER — Ambulatory Visit
Admission: RE | Admit: 2016-02-13 | Discharge: 2016-02-13 | Disposition: A | Discharge status: Home / Self Care | Payer: BLUE CROSS/BLUE SHIELD | Source: Ambulatory Visit | Attending: Adult Health | Admitting: Adult Health

## 2016-02-13 DIAGNOSIS — I729 Aneurysm of unspecified site: Secondary | ICD-10-CM

## 2016-02-13 DIAGNOSIS — I671 Cerebral aneurysm, nonruptured: Secondary | ICD-10-CM | POA: Diagnosis not present

## 2016-02-28 ENCOUNTER — Other Ambulatory Visit: Payer: Self-pay | Admitting: *Deleted

## 2016-02-28 MED ORDER — LAMOTRIGINE 100 MG PO TABS: ORAL_TABLET | ORAL | 0 refills | Status: DC

## 2016-03-04 ENCOUNTER — Telehealth: Payer: Self-pay | Admitting: Family Medicine

## 2016-03-04 ENCOUNTER — Other Ambulatory Visit: Payer: Self-pay | Admitting: Emergency Medicine

## 2016-03-04 MED ORDER — LAMOTRIGINE 100 MG PO TABS: ORAL_TABLET | ORAL | 0 refills | Status: DC

## 2016-03-04 NOTE — Telephone Encounter (Signed)
[facility]

## 2016-03-04 NOTE — Telephone Encounter (Signed)
Pt would like new rx lamictal 100 mg #90 w/[facility]ill [facility]

## 2016-04-08 ENCOUNTER — Other Ambulatory Visit: Payer: Self-pay | Admitting: Family Medicine

## 2016-04-08 DIAGNOSIS — Z1231 Encounter for screening mammogram for malignant neoplasm of breast: Secondary | ICD-10-CM

## 2016-04-15 ENCOUNTER — Ambulatory Visit
Admission: RE | Admit: 2016-04-15 | Discharge: 2016-04-15 | Disposition: A | Discharge status: Home / Self Care | Payer: BLUE CROSS/BLUE SHIELD | Source: Ambulatory Visit | Attending: Family Medicine | Admitting: Family Medicine

## 2016-04-15 DIAGNOSIS — Z1231 Encounter for screening mammogram for malignant neoplasm of breast: Secondary | ICD-10-CM | POA: Diagnosis not present

## 2016-05-29 ENCOUNTER — Other Ambulatory Visit: Payer: Self-pay | Admitting: Family Medicine

## 2016-06-02 ENCOUNTER — Other Ambulatory Visit: Payer: Self-pay | Admitting: Family Medicine

## 2016-06-05 ENCOUNTER — Other Ambulatory Visit: Payer: Self-pay | Admitting: Family Medicine

## 2016-06-27 ENCOUNTER — Ambulatory Visit (INDEPENDENT_AMBULATORY_CARE_PROVIDER_SITE_OTHER): Payer: BLUE CROSS/BLUE SHIELD | Admitting: Adult Health

## 2016-06-27 ENCOUNTER — Encounter: Payer: Self-pay | Admitting: Adult Health

## 2016-06-27 ENCOUNTER — Other Ambulatory Visit: Payer: Self-pay | Admitting: Adult Health

## 2016-06-27 ENCOUNTER — Telehealth: Payer: Self-pay | Admitting: Family Medicine

## 2016-06-27 VITALS — BP 112/64 | Temp 97.9°F | Ht 63.75 in | Wt 125.1 lb

## 2016-06-27 DIAGNOSIS — L719 Rosacea, unspecified: Secondary | ICD-10-CM

## 2016-06-27 MED ORDER — CLINDAMYCIN PHOSPHATE 1 % EX LOTN: TOPICAL_LOTION | Freq: Two times a day (BID) | CUTANEOUS | 3 refills | Status: DC

## 2016-06-27 NOTE — Progress Notes (Signed)
Subjective:    Patient ID: Ruth Stanley, female    DOB: 12-26-1960, 55 y.o.   MRN: [id number]  HPI  55 year old female who  has a past medical history of Constipation; Hypertension; and Seizures ([place]). She presents to the office today for the acute complaint of Rosacea. She reports that back in October she did a Tele-Doc visit and he diagnosed her with Rosacea. He prescribed a cream which has worked well for her. She needs a refill on this cream. She does not know what it is called.   She would like to know if there are other options to treat Rosacea   Review of Systems  Constitutional: Negative.   Skin: Positive for color change.  All other systems reviewed and are negative.  Past Medical History:  Diagnosis Date  . Constipation   . Hypertension   . Seizures ([place])    last one 06/11/2012     Social History   Social History  . Marital status: Married    Spouse name: N/A  . Number of children: N/A  . Years of education: N/A   Occupational History  . Not on file.   Social History Main Topics  . Smoking status: Former Smoker    Packs/day: 0.25    Types: Cigarettes    Quit date: 10/15/2004  . Smokeless tobacco: Never Used  . Alcohol use 0.0 oz/week     Comment: occasional wine  . Drug use: No  . Sexual activity: Yes    Birth control/ protection: Post-menopausal   Other Topics Concern  . Not on file   Social History Narrative  . No narrative on file    Past Surgical History:  Procedure Laterality Date  . CEREBRAL ANEURYSM REPAIR  2006  . COLONOSCOPY  07/2009   Ardis Hughs - Hx polyp  . WISDOM TOOTH EXTRACTION      Family History  Problem Relation Age of Onset  . Colon cancer Neg Hx   . Colon polyps Neg Hx   . Esophageal cancer Neg Hx   . Rectal cancer Neg Hx   . Stomach cancer Neg Hx     No Known Allergies  Current Outpatient Prescriptions on File Prior to Visit  Medication Sig Dispense Refill  . aspirin 325 MG tablet Take 325 mg by mouth daily.      . calcium carbonate 200 MG capsule Take 250 mg by mouth 2 (two) times daily with a meal.    . lamoTRIgine (LAMICTAL) 100 MG tablet TAKE 1 TABLET IN THE MORNING AND 1 TABLET IN THE EVENING (PLEASE SCHEDULE APPOINTMENT FOR REFILLS) 180 tablet 0  . lisinopril (PRINIVIL,ZESTRIL) 10 MG tablet Take 1 tablet (10 mg total) by mouth daily. 90 tablet 3  . Multiple Vitamins-Minerals (CENTRUM SILVER PO) Take by mouth.     No current facility-administered medications on file prior to visit.     BP 112/64   Temp 97.9 F (36.6 C) (Oral)   Ht 5' 3.75" (1.619 m)   Wt 125 lb 1.6 oz (56.7 kg)   LMP 06/18/2010   BMI 21.64 kg/m       Objective:   Physical Exam  Constitutional: She is oriented to person, place, and time. She appears well-developed and well-nourished. No distress.  Cardiovascular: Normal rate, regular rhythm, normal heart sounds and intact distal pulses.  Exam reveals no gallop and no friction rub.   No murmur heard. Pulmonary/Chest: Effort normal and breath sounds normal. No respiratory distress. She has no  wheezes. She has no rales.  Musculoskeletal: Normal range of motion. She exhibits no deformity.  Neurological: She is alert and oriented to person, place, and time. She has normal reflexes. She displays normal reflexes. No cranial nerve deficit. She exhibits normal muscle tone. Coordination normal.  Skin: Skin is warm and dry. No rash noted. She is not diaphoretic. There is erythema (bilateral cheeks. Consistent with Rosacea ). No pallor.  Psychiatric: She has a normal mood and affect. Her behavior is normal. Judgment and thought content normal.  Nursing note and vitals reviewed.     Assessment & Plan:  1. Rosacea - She will call back with which lotion she is using.  - Triggers given  - Advised she could follow up with Dermatology for further treatment   Dorothyann Peng, NP

## 2016-06-27 NOTE — Telephone Encounter (Signed)
Pt states she was to inform [facility] of the med for her rosacea. It is Clindamycin 1%  [facility]

## 2016-06-27 NOTE — Telephone Encounter (Signed)
Clinda lotion was sent to her local pharmacy

## 2016-06-27 NOTE — Telephone Encounter (Signed)
See below

## 2016-08-30 ENCOUNTER — Ambulatory Visit (INDEPENDENT_AMBULATORY_CARE_PROVIDER_SITE_OTHER): Payer: BLUE CROSS/BLUE SHIELD | Admitting: Family Medicine

## 2016-08-30 ENCOUNTER — Encounter: Payer: Self-pay | Admitting: Family Medicine

## 2016-08-30 VITALS — BP 142/84 | HR 84 | Temp 98.3°F | Wt 126.2 lb

## 2016-08-30 DIAGNOSIS — R6889 Other general symptoms and signs: Secondary | ICD-10-CM

## 2016-08-30 DIAGNOSIS — R52 Pain, unspecified: Secondary | ICD-10-CM

## 2016-08-30 DIAGNOSIS — S99912A Unspecified injury of left ankle, initial encounter: Secondary | ICD-10-CM | POA: Diagnosis not present

## 2016-08-30 LAB — POCT INFLUENZA A: Rapid Influenza A Ag: NEGATIVE

## 2016-08-30 NOTE — Progress Notes (Signed)
Pre visit review using our clinic review tool, if applicable. No additional management support is needed unless otherwise documented below in the visit note. 

## 2016-08-30 NOTE — Patient Instructions (Signed)
Please continue with fexofenadine for symptoms.  Your symptoms are most likely related to a viral illness and I am glad that you are feeling better. Please drink plenty of water so that your urine is pale yellow or clear. Also, get plenty of rest, use tylenol or ibuprofen as needed for discomfort and follow up if symptoms do not improve in 3 to 4 days, worsen, or you develop a fever >101.  Ibuprofen as mentioned above is helpful for the discomfort and swelling in your ankle. This should be used no more than one week and please take with food.  If ankle does not improve with compression, elevation, and rest, or if your symptom becomes worse, we will order an X-ray however at this time, your exam does not indicate a need to do so.   Ankle Sprain Introduction An ankle sprain is a stretch or tear in one of the tough tissues (ligaments) in your ankle. Follow these instructions at home:  Rest your ankle.  Take over-the-counter and prescription medicines only as told by your doctor.  For 2-3 days, keep your ankle higher than the level of your heart (elevated) as much as possible.  If directed, put ice on the area:  Put ice in a plastic bag.  Place a towel between your skin and the bag.  Leave the ice on for 20 minutes, 2-3 times a day.  If you were given a brace:  Wear it as told.  Take it off to shower or bathe.  Try not to move your ankle much, but wiggle your toes from time to time. This helps to prevent swelling.  If you were given an elastic bandage (dressing):  Take it off when you shower or bathe.  Try not to move your ankle much, but wiggle your toes from time to time. This helps to prevent swelling.  Adjust the bandage to make it more comfortable if it feels too tight.  Loosen the bandage if you lose feeling in your foot, your foot tingles, or your foot gets cold and blue.  If you have crutches, use them as told by your doctor. Continue to use them until you can walk  without feeling pain in your ankle. Contact a doctor if:  Your bruises or swelling are quickly getting worse.  Your pain does not get better after you take medicine. Get help right away if:  You cannot feel your toes or foot.  Your toes or your foot looks blue.  You have very bad pain that gets worse. This information is not intended to replace advice given to you by your health care provider. Make sure you discuss any questions you have with your health care provider. Document Released: 12/11/2007 Document Revised: 11/30/2015 Document Reviewed: 01/24/2015  2017 Elsevier

## 2016-08-30 NOTE — Progress Notes (Signed)
Patient ID: RILIE GLANZ, female   DOB: 1961/05/26, 56 y.o.   MRN: [id number]  PCP: Joycelyn Man, MD  Subjective:  Ruth Stanley is a 56 y.o. year old very pleasant female patient who presents with flu like symptoms including fever,body aches.  -other symptoms: Sore throat which has improved, rhinitis with yellow drainage, cough that has improved -started: over one week ago -inside 48 hour treatment window if needed for tamiflu:No  -high risk condition:  No  -symptoms improving -previous treatments:See below - patient did not receive flu shot this year.  - Recent sick contact with husband and grandson who is 11 years old She completed an e-visit and received fexofenadine and ipatropium bromide nasal solution has provided limited benefit.  Left foot pain noted today after "twisting ankle" by slipping on her robe on the steps.  Pain is rated as as a 9 if walking but improves when sitting. She denies inability to ambulate. She denies numbness, tingling, weakness, and denies that slipping is related to a cardio/neuro prodome. She denies hitting her head. She reports sitting on her "bottom" after she twisting her ankle. Treatment with ice has provided moderate benefit.  ROS-denies SOB, NVD, sinus or dental pain; left ankle pain and swelling  Pertinent Past Medical History-  Patient Active Problem List   Diagnosis Date Noted  . Chronic constipation 06/20/2014  . Bright red rectal bleeding 06/20/2014  . Cerebral aneurysm 01/27/2014  . RHINITIS 07/13/2009  . PERIMENOPAUSAL SYNDROME 02/22/2008  . SEIZURE DISORDER 03/23/2007  . HYPERTENSION 02/26/2007    Medications- reviewed  Current Outpatient Prescriptions  Medication Sig Dispense Refill  . aspirin 325 MG tablet Take 325 mg by mouth daily.    . calcium carbonate 200 MG capsule Take 250 mg by mouth 2 (two) times daily with a meal.    . clindamycin (CLEOCIN T) 1 % lotion Apply topically 2 (two) times daily. 60 mL 3  .  lamoTRIgine (LAMICTAL) 100 MG tablet TAKE 1 TABLET IN THE MORNING AND 1 TABLET IN THE EVENING (PLEASE SCHEDULE APPOINTMENT FOR REFILLS) 180 tablet 0  . lisinopril (PRINIVIL,ZESTRIL) 10 MG tablet Take 1 tablet (10 mg total) by mouth daily. 90 tablet 3  . Multiple Vitamins-Minerals (CENTRUM SILVER PO) Take by mouth.     No current facility-administered medications for this visit.     Objective: BP (!) 142/84 (BP Location: Left Arm, Patient Position: Sitting, Cuff Size: Normal)   Pulse 84   Temp 98.3 F (36.8 C) (Oral)   Wt 126 lb 3.2 oz (57.2 kg)   LMP 06/18/2010   SpO2 98%   BMI 21.83 kg/m  Gen: NAD, appears fatigued HEENT: Turbinates erythematous, TM normal, pharynx mildly erythematous with no tonsilar exudate or edema, no sinus tenderness CV: RRR no murmurs rubs or gallops Lungs: CTAB no crackles, wheeze, rhonchi Abdomen: soft/nontender/nondistended/normal bowel sounds. Ext: no edema Skin: warm, dry, no rash Left ankle with minimal swelling and no ecchymosis present. Full ROM with mild discomfort present. No tenderness to lateral or medial malleolus, no tenderness with palpation to tibia/fibula. Able to bear weight.  Assessment/Plan:   1. Injury of left ankle, initial encounter Able to bear weight; no obvious deformity and able to complete active Full ROM. Applied ice pack and wrapped with coban for support. Advised use of ice for 20 minutes on and then 20 minutes off, elevation, apply ACE wrap for support, and elevate. Short term use of ibuprofen for symptoms with food. If treatment does not provide benefit or  if pain worsens, or she has trouble with ambulating; will consider imaging.  2. Flu-like symptoms Flu-like illness:  History and exam today are suggestive of viral process. Patients influenza test was negative.  Pretest probability of influenza was low. High risk condition: No  Patient not treated with tamiflu. Prophylaxis for other [place] patients:  Symptomatic  treatment with: Ibuprofen and tylenol   3. Body aches Negative; symptoms are improving - POCT Influenza A   Finally, we reviewed reasons to return to care including if symptoms worsen or persist or new concerns arise.  Delano Metz, FNP-C

## 2016-08-31 ENCOUNTER — Other Ambulatory Visit: Payer: Self-pay | Admitting: Family Medicine

## 2016-09-02 ENCOUNTER — Other Ambulatory Visit: Payer: Self-pay | Admitting: Family Medicine

## 2016-09-02 ENCOUNTER — Telehealth: Payer: Self-pay | Admitting: Family Medicine

## 2016-09-02 DIAGNOSIS — S99912S Unspecified injury of left ankle, sequela: Secondary | ICD-10-CM

## 2016-09-02 NOTE — Telephone Encounter (Signed)
X-ray ordered for evaluation at [facility]

## 2016-09-02 NOTE — Telephone Encounter (Signed)
Called Patient' husband and is aware. Stated that they will go in the morning at 9 am.

## 2016-09-02 NOTE — Progress Notes (Signed)
Called  patient's husband regarding to request for an order for an ankle X-ray, notified patient of orders sent to [facility] for imaging by Gregary Signs. Patient's husband stated that they would go the following day at 9 am.

## 2016-09-02 NOTE — Progress Notes (Unsigned)
Patient evaluated on 08/30/16 with suspect ankle sprain. Treatment conservatively and now reports no improvement. X-ray of ankle ordered for further evaluation and treatment.

## 2016-09-02 NOTE — Telephone Encounter (Signed)
Husband states pt's foot is definitely not better, if anything worse.  Pt states Gregary Signs mentioned an Xray.  Pt would like to proceed with an xray if she could get an order.. Please call the husband, william.

## 2016-09-03 ENCOUNTER — Other Ambulatory Visit: Payer: Self-pay

## 2016-09-03 ENCOUNTER — Other Ambulatory Visit: Payer: Self-pay | Admitting: Family Medicine

## 2016-09-03 ENCOUNTER — Ambulatory Visit (INDEPENDENT_AMBULATORY_CARE_PROVIDER_SITE_OTHER)
Admission: RE | Admit: 2016-09-03 | Discharge: 2016-09-03 | Disposition: A | Discharge status: Home / Self Care | Payer: BLUE CROSS/BLUE SHIELD | Source: Ambulatory Visit | Attending: Family Medicine | Admitting: Family Medicine

## 2016-09-03 DIAGNOSIS — S99912S Unspecified injury of left ankle, sequela: Secondary | ICD-10-CM | POA: Diagnosis not present

## 2016-09-03 DIAGNOSIS — S92352A Displaced fracture of fifth metatarsal bone, left foot, initial encounter for closed fracture: Secondary | ICD-10-CM | POA: Diagnosis not present

## 2016-09-03 NOTE — Telephone Encounter (Signed)
Denied.  Last 2 refills have requested the pt call for an appointment.  No future appointment on file.  Message sent to the pharmacy for the pt to call and make an appointment.

## 2016-09-04 ENCOUNTER — Other Ambulatory Visit: Payer: Self-pay | Admitting: Family Medicine

## 2016-09-04 ENCOUNTER — Other Ambulatory Visit: Payer: Self-pay

## 2016-09-04 DIAGNOSIS — S92352A Displaced fracture of fifth metatarsal bone, left foot, initial encounter for closed fracture: Secondary | ICD-10-CM

## 2016-09-05 DIAGNOSIS — S92355A Nondisplaced fracture of fifth metatarsal bone, left foot, initial encounter for closed fracture: Secondary | ICD-10-CM | POA: Diagnosis not present

## 2016-09-26 DIAGNOSIS — S92355D Nondisplaced fracture of fifth metatarsal bone, left foot, subsequent encounter for fracture with routine healing: Secondary | ICD-10-CM | POA: Diagnosis not present

## 2016-10-17 DIAGNOSIS — S92355D Nondisplaced fracture of fifth metatarsal bone, left foot, subsequent encounter for fracture with routine healing: Secondary | ICD-10-CM | POA: Diagnosis not present

## 2016-11-07 DIAGNOSIS — S92355D Nondisplaced fracture of fifth metatarsal bone, left foot, subsequent encounter for fracture with routine healing: Secondary | ICD-10-CM | POA: Diagnosis not present

## 2016-11-13 DIAGNOSIS — S92355D Nondisplaced fracture of fifth metatarsal bone, left foot, subsequent encounter for fracture with routine healing: Secondary | ICD-10-CM | POA: Diagnosis not present

## 2016-11-18 DIAGNOSIS — L718 Other rosacea: Secondary | ICD-10-CM | POA: Diagnosis not present

## 2016-11-19 ENCOUNTER — Other Ambulatory Visit: Payer: Self-pay | Admitting: Family Medicine

## 2016-11-19 ENCOUNTER — Telehealth: Payer: Self-pay | Admitting: Family Medicine

## 2016-11-19 NOTE — Telephone Encounter (Signed)
Filled for 90 days.  Pt past due for cpx and labs.  Message sent to scheduling.  Did have normal BMP done by [facility] 01/30/16.

## 2016-11-19 NOTE — Telephone Encounter (Signed)
Pt due for cpx.  Please help her to get on the schedule.  She should come fasting for lab work.  Medication filled for 90 days.  Thanks!!!

## 2016-11-19 NOTE — Telephone Encounter (Signed)
Pt has been sch for sep

## 2016-12-16 ENCOUNTER — Other Ambulatory Visit: Payer: Self-pay

## 2016-12-16 MED ORDER — LAMOTRIGINE 100 MG PO TABS: ORAL_TABLET | ORAL | 2 refills | Status: DC

## 2017-02-17 ENCOUNTER — Other Ambulatory Visit: Payer: Self-pay | Admitting: Family Medicine

## 2017-02-24 DIAGNOSIS — L718 Other rosacea: Secondary | ICD-10-CM | POA: Diagnosis not present

## 2017-03-11 ENCOUNTER — Encounter: Payer: Self-pay | Admitting: Family Medicine

## 2017-03-11 ENCOUNTER — Ambulatory Visit (INDEPENDENT_AMBULATORY_CARE_PROVIDER_SITE_OTHER): Payer: BLUE CROSS/BLUE SHIELD | Admitting: Family Medicine

## 2017-03-11 VITALS — BP 122/78 | HR 86 | Ht 63.75 in | Wt 120.9 lb

## 2017-03-11 DIAGNOSIS — Z23 Encounter for immunization: Secondary | ICD-10-CM

## 2017-03-11 DIAGNOSIS — K5909 Other constipation: Secondary | ICD-10-CM

## 2017-03-11 DIAGNOSIS — L719 Rosacea, unspecified: Secondary | ICD-10-CM | POA: Diagnosis not present

## 2017-03-11 DIAGNOSIS — K625 Hemorrhage of anus and rectum: Secondary | ICD-10-CM

## 2017-03-11 DIAGNOSIS — Z0001 Encounter for general adult medical examination with abnormal findings: Secondary | ICD-10-CM

## 2017-03-11 DIAGNOSIS — I10 Essential (primary) hypertension: Secondary | ICD-10-CM | POA: Diagnosis not present

## 2017-03-11 DIAGNOSIS — Z0000 Encounter for general adult medical examination without abnormal findings: Secondary | ICD-10-CM

## 2017-03-11 LAB — CBC WITH DIFFERENTIAL/PLATELET
Basophils Absolute: 0.1 10*3/uL (ref 0.0–0.1)
Basophils Relative: 0.7 % (ref 0.0–3.0)
Eosinophils Absolute: 0.2 10*3/uL (ref 0.0–0.7)
Eosinophils Relative: 2 % (ref 0.0–5.0)
HCT: 33.3 % — ABNORMAL LOW (ref 36.0–46.0)
Hemoglobin: 10.7 g/dL — ABNORMAL LOW (ref 12.0–15.0)
Lymphocytes Relative: 8.5 % — ABNORMAL LOW (ref 12.0–46.0)
Lymphs Abs: 0.7 10*3/uL (ref 0.7–4.0)
MCHC: 32.1 g/dL (ref 30.0–36.0)
MCV: 82.9 fl (ref 78.0–100.0)
Monocytes Absolute: 0.5 10*3/uL (ref 0.1–1.0)
Monocytes Relative: 6.3 % (ref 3.0–12.0)
Neutro Abs: 6.6 10*3/uL (ref 1.4–7.7)
Neutrophils Relative %: 82.5 % — ABNORMAL HIGH (ref 43.0–77.0)
Platelets: 399 10*3/uL (ref 150.0–400.0)
RBC: 4.02 Mil/uL (ref 3.87–5.11)
RDW: 14 % (ref 11.5–15.5)
WBC: 8 10*3/uL (ref 4.0–10.5)

## 2017-03-11 LAB — HEPATIC FUNCTION PANEL
ALT: 9 U/L (ref 0–35)
AST: 15 U/L (ref 0–37)
Albumin: 4 g/dL (ref 3.5–5.2)
Alkaline Phosphatase: 66 U/L (ref 39–117)
Bilirubin, Direct: 0.1 mg/dL (ref 0.0–0.3)
Total Bilirubin: 0.4 mg/dL (ref 0.2–1.2)
Total Protein: 6.6 g/dL (ref 6.0–8.3)

## 2017-03-11 LAB — POCT URINALYSIS DIPSTICK
Bilirubin, UA: NEGATIVE
Blood, UA: NEGATIVE
Clarity, UA: NEGATIVE
Glucose, UA: NEGATIVE
Leukocytes, UA: NEGATIVE
Nitrite, UA: NEGATIVE
Spec Grav, UA: >=1.03 — AB (ref 1.010–1.025)
Urobilinogen, UA: 0.2 E.U./dL
pH, UA: 5.5 (ref 5.0–8.0)

## 2017-03-11 LAB — BASIC METABOLIC PANEL
BUN: 12 mg/dL (ref 6–23)
CO2: 26 mEq/L (ref 19–32)
Calcium: 9.2 mg/dL (ref 8.4–10.5)
Chloride: 105 mEq/L (ref 96–112)
Creatinine, Ser: 0.75 mg/dL (ref 0.40–1.20)
GFR: 84.16 mL/min (ref >60.00)
Glucose, Bld: 97 mg/dL (ref 70–99)
Potassium: 4.7 mEq/L (ref 3.5–5.1)
Sodium: 139 mEq/L (ref 135–145)

## 2017-03-11 LAB — LIPID PANEL
Cholesterol: 191 mg/dL (ref 0–200)
HDL: 53.9 mg/dL (ref >39.00)
LDL Cholesterol: 124 mg/dL — ABNORMAL HIGH (ref 0–99)
NonHDL: 137.16
Total CHOL/HDL Ratio: 4
Triglycerides: 66 mg/dL (ref 0.0–149.0)
VLDL: 13.2 mg/dL (ref 0.0–40.0)

## 2017-03-11 LAB — TSH: TSH: 0.88 u[IU]/mL (ref 0.35–4.50)

## 2017-03-11 MED ORDER — LISINOPRIL 10 MG PO TABS: 10.0000 mg | ORAL_TABLET | Freq: Every day | ORAL | 4 refills | Status: DC

## 2017-03-11 MED ORDER — LAMOTRIGINE 100 MG PO TABS: ORAL_TABLET | ORAL | 4 refills | Status: DC

## 2017-03-11 NOTE — Patient Instructions (Signed)
Begin a daily exercise program. It's important for your bone density lift weights twice weekly  Continue current medication  Follow-up in one year sooner if any problems. Increase a stool softener to a point where the rectal bleeding stops.  We'll set you up for a bone density to measure your bone density  Astroglide is a good vaginal lubricant  Labs today  We'll call you if there is anything abnormal

## 2017-03-11 NOTE — Progress Notes (Signed)
Ruth Stanley is a 56 year old married female nonsmoker G1 P1,,,, LMP 8 years ago,,,, who comes in today for general physical examination because of a history of seizure disorder hypertension and new problem of rosacea.  Years ago she had a CNS aneurysm that was repaired she subsequently had a seizure. She's been on Lamictal 100 mg twice a day since that time has had no problems.  She takes lisinopril 10 mg daily for hypertension BP today 122/78  She takes calcium vitamin D and gets a lot of sun exposure. She's never had a bone density.  Her last menstrual periods about 8 years ago. She's relatively asymptomatic except for some slight dryness. She has minimal hot flushes and does not request any supplements.  She has a history of bright red rectal bleeding secondary to constipation. This is not a chronic problem. She had a colonoscopy by Dr. Ardis Hughs which was normal except for some polyps. Polyps removed. She's had no problems since then except for the current bleeding. We've outlined a program stop the bleeding.  She gets routine eye care, dental care, BSE monthly, annual mammography, last colonoscopy by Dr. Ardis Hughs as noted above. 14 point review of systems reviewed and otherwise negative except for recent problem of rosacea. She went to a dermatologist. She is on MetroGel and doxycycline when necessary  She's also had a fracture of 3 bones in her left foot and Herbert 2018 from a traumatic fall at home. She was in a cast for 4-6 weeks via or so. She's done well no problems. Last Pap smear was 2016. She never had trouble with her Paps in the past therefore recommend Paps every 3 years.  Family history unchanged her son recently moved back here from [place]. He works for [employer].  She does not exercise on a regular basis. Advised 30 minutes of daily exercise.  Vaccinations up-to-date declines a flu shot at this juncture tetanus booster...Marland KitchenMarland KitchenMarland Kitchen given today  Physical examinationBP 122/78   Pulse 86    Ht 5' 3.75" (1.619 m)   Wt 120 lb 14.4 oz (54.8 kg)   LMP 06/18/2010 (Exact Date)   SpO2 99%   BMI 20.92 kg/m  Examinational HEENT were negative neck was supple thyroid not enlarged no carotid bruits cardiopulmonary exam normal breast exam normal abdominal exam normal pelvic examination external genitalia within normal limits vaginal vault was normal bimanual exam negative. Rectum Brown stool guaiac positive  Extremities normal skin normal peripheral pulses normal  #1 history of seizure disorder....... continue Lamictal  #2 history of hypertension...Marland KitchenMarland KitchenMarland Kitchen BP normal on lisinopril 10 mg daily continue that dose  #3 acne rosacea.........Marland Kitchen MetroGel and doxycycline when necessary  #4 status post fracture 3 bones left foot February 2018..... Asymptomatic....... check BMD  History of constipation with secondary rectal bleeding........ outlined program to stop the bleeding

## 2017-03-12 ENCOUNTER — Ambulatory Visit (INDEPENDENT_AMBULATORY_CARE_PROVIDER_SITE_OTHER)
Admission: RE | Admit: 2017-03-12 | Discharge: 2017-03-12 | Disposition: A | Discharge status: Home / Self Care | Payer: BLUE CROSS/BLUE SHIELD | Source: Ambulatory Visit | Attending: Family Medicine | Admitting: Family Medicine

## 2017-03-12 DIAGNOSIS — M85872 Other specified disorders of bone density and structure, left ankle and foot: Secondary | ICD-10-CM

## 2017-03-12 DIAGNOSIS — Z0000 Encounter for general adult medical examination without abnormal findings: Secondary | ICD-10-CM

## 2017-03-14 ENCOUNTER — Other Ambulatory Visit: Payer: Self-pay | Admitting: Family Medicine

## 2017-03-14 DIAGNOSIS — Z1231 Encounter for screening mammogram for malignant neoplasm of breast: Secondary | ICD-10-CM

## 2017-03-23 DIAGNOSIS — M85872 Other specified disorders of bone density and structure, left ankle and foot: Secondary | ICD-10-CM | POA: Diagnosis not present

## 2017-04-17 ENCOUNTER — Ambulatory Visit
Admission: RE | Admit: 2017-04-17 | Discharge: 2017-04-17 | Disposition: A | Discharge status: Home / Self Care | Payer: BLUE CROSS/BLUE SHIELD | Source: Ambulatory Visit | Attending: Family Medicine | Admitting: Family Medicine

## 2017-04-17 DIAGNOSIS — Z1231 Encounter for screening mammogram for malignant neoplasm of breast: Secondary | ICD-10-CM | POA: Diagnosis not present

## 2017-05-12 ENCOUNTER — Telehealth: Payer: Self-pay

## 2017-05-12 ENCOUNTER — Ambulatory Visit (INDEPENDENT_AMBULATORY_CARE_PROVIDER_SITE_OTHER): Payer: BLUE CROSS/BLUE SHIELD | Admitting: Family Medicine

## 2017-05-12 ENCOUNTER — Encounter: Payer: Self-pay | Admitting: Family Medicine

## 2017-05-12 VITALS — BP 110/70 | HR 72 | Temp 97.6°F | Wt 125.0 lb

## 2017-05-12 DIAGNOSIS — K625 Hemorrhage of anus and rectum: Secondary | ICD-10-CM

## 2017-05-12 DIAGNOSIS — K51811 Other ulcerative colitis with rectal bleeding: Secondary | ICD-10-CM | POA: Diagnosis not present

## 2017-05-12 LAB — CBC WITH DIFFERENTIAL/PLATELET
Basophils Absolute: 0 10*3/uL (ref 0.0–0.1)
Basophils Relative: 0.7 % (ref 0.0–3.0)
Eosinophils Absolute: 0.2 10*3/uL (ref 0.0–0.7)
Eosinophils Relative: 2.8 % (ref 0.0–5.0)
HCT: 30.4 % — ABNORMAL LOW (ref 36.0–46.0)
Hemoglobin: 9.6 g/dL — ABNORMAL LOW (ref 12.0–15.0)
Lymphocytes Relative: 15.2 % (ref 12.0–46.0)
Lymphs Abs: 0.9 10*3/uL (ref 0.7–4.0)
MCHC: 31.6 g/dL (ref 30.0–36.0)
MCV: 77.8 fl — ABNORMAL LOW (ref 78.0–100.0)
Monocytes Absolute: 0.4 10*3/uL (ref 0.1–1.0)
Monocytes Relative: 7.7 % (ref 3.0–12.0)
Neutro Abs: 4.2 10*3/uL (ref 1.4–7.7)
Neutrophils Relative %: 73.6 % (ref 43.0–77.0)
Platelets: 409 10*3/uL — ABNORMAL HIGH (ref 150.0–400.0)
RBC: 3.91 Mil/uL (ref 3.87–5.11)
RDW: 15.5 % (ref 11.5–15.5)
WBC: 5.7 10*3/uL (ref 4.0–10.5)

## 2017-05-12 LAB — IRON: Iron: 8 ug/dL — ABNORMAL LOW (ref 42–145)

## 2017-05-12 LAB — FERRITIN: Ferritin: 6 ng/mL — ABNORMAL LOW (ref 10.0–291.0)

## 2017-05-12 LAB — RETICULOCYTES
ABS Retic: 38300 cells/uL (ref 20000–8000)
Retic Ct Pct: 1 %

## 2017-05-12 MED ORDER — MESALAMINE 1.2 G PO TBEC: 4.8000 g | DELAYED_RELEASE_TABLET | Freq: Every day | ORAL | 3 refills | Status: DC

## 2017-05-12 NOTE — Progress Notes (Signed)
Merry Proud, Thanks for the message. We'll reach out to her.  Patty, Can you please contact her.  I'd like her to be on lialda 1.2gm, 4 pills once daily. Can you check with her about cost, which may have changed since last visit 1-2 years ago.  If that is still cost prohibitive there are several other options including seeing if she qualifies for drug company program.

## 2017-05-12 NOTE — Telephone Encounter (Signed)
Ruth Stanley, Thanks for the message. We'll reach out to her.  [name], Can you please contact her.  I'd like her to be on lialda 1.2gm, 4 pills once daily. Can you check with her about cost, which may have changed since last visit 1-2 years ago.  If that is still cost prohibitive there are several other options including seeing if she qualifies for drug company program.     The pt is aware and [facility] and she will call if the medication is too expensive.

## 2017-05-12 NOTE — Telephone Encounter (Signed)
-----   Message from Milus Banister, MD sent at 05/12/2017  1:01 PM EST -----   ----- Message ----- From: Dorena Cookey, MD Sent: 05/12/2017   9:39 AM To: Milus Banister, MD

## 2017-05-12 NOTE — Progress Notes (Signed)
Helene Kelp is a 57 year old married female nonsmoker who comes in today for follow-up of anemia  We saw her a month ago for general physical examination at that time her hemoglobin dropped from 13-10. She had a diagnosis of left-sided ulcerative colitis in 2017 by Dr. Ardis Hughs. At that time she was placed on the medication the cost $730 for 120 pills. With her insurance she only pain $125 however she's wondering if there is something she can take that would work just as well this is not so expensive. I will send a copy this note to Dr. Ardis Hughs to get his feedback  Since that time she's been taking iron one tablet in the morning. She's only had one episode of bright red rectal bleeding since that time. She says she feels well no fever chills abdominal pain nausea vomiting etc. Balance are normal except a little dark because of the iron.  Blood pressure at home normal 110/70.  BP 110/70 (BP Location: Left Arm, Patient Position: Sitting, Cuff Size: Normal)   Pulse 72   Temp 97.6 F (36.4 C) (Oral)   Wt 125 lb (56.7 kg)   LMP 06/18/2010 (Exact Date)   BMI 21.62 kg/m  General she is well-developed well-nourished female no acute distress  #1 left-sided ulcerative colitis with anemia......Marland Kitchen recheck CBC....... consult with Dr. Ardis Hughs concerning medication.  #2 hypertension at goal,,,,,, continue current therapy

## 2017-05-12 NOTE — Patient Instructions (Signed)
Labs today.......... I will call you the report  I will also send a copy of this note to Dr. Ardis Hughs your gastroenterologist to see if this medication we can take that would be cheaper.

## 2017-06-18 ENCOUNTER — Telehealth: Payer: Self-pay | Admitting: Gastroenterology

## 2017-06-18 MED ORDER — MESALAMINE 1.2 G PO TBEC: 4.8000 g | DELAYED_RELEASE_TABLET | Freq: Every day | ORAL | 2 refills | Status: DC

## 2017-06-18 NOTE — Telephone Encounter (Signed)
The prescription was sent to the pharmacy as requested

## 2017-06-18 NOTE — Telephone Encounter (Signed)
Pharmacy wants to know if they can get 90 day supply of medication lialda due to being mail order. Pt reference [id number] and can be faxed to [phone]

## 2017-07-23 ENCOUNTER — Telehealth: Payer: Self-pay | Admitting: Family Medicine

## 2017-07-23 NOTE — Telephone Encounter (Unsigned)
Copied from [place]. Topic: Inquiry >> Jul 23, 2017  3:31 PM Neva Seat wrote: Pt needs to discuss testing that was done on her at the end of 2017 or 2018 due to a fall.  Please call pt back to discuss asap.

## 2017-07-28 NOTE — Telephone Encounter (Signed)
Please Advise

## 2018-01-27 DIAGNOSIS — H00024 Hordeolum internum left upper eyelid: Secondary | ICD-10-CM | POA: Diagnosis not present

## 2018-02-03 DIAGNOSIS — H00014 Hordeolum externum left upper eyelid: Secondary | ICD-10-CM | POA: Diagnosis not present

## 2018-02-11 ENCOUNTER — Other Ambulatory Visit: Payer: Self-pay | Admitting: Adult Health

## 2018-02-11 ENCOUNTER — Other Ambulatory Visit: Payer: Self-pay | Admitting: Family Medicine

## 2018-02-11 NOTE — Telephone Encounter (Unsigned)
Copied from [place] [id number]. Topic: Quick Communication - Rx Refill/Question >> Feb 11, 2018  4:18 PM Judyann Munson wrote: Medication:clindamycin (CLEOCIN T) 1 % lotion   Has the patient contacted their pharmacy? yes  Preferred Pharmacy (with phone number or street name): [facility], [address] [place] [phone] (Phone) [phone] (Fax)      Agent: Please be advised that RX refills may take up to 3 business days. We ask that you follow-up with your pharmacy.

## 2018-02-13 ENCOUNTER — Other Ambulatory Visit: Payer: Self-pay

## 2018-02-13 DIAGNOSIS — H00026 Hordeolum internum left eye, unspecified eyelid: Secondary | ICD-10-CM | POA: Diagnosis not present

## 2018-02-13 MED ORDER — CLINDAMYCIN PHOSPHATE 1 % EX LOTN: TOPICAL_LOTION | Freq: Two times a day (BID) | CUTANEOUS | 3 refills | Status: DC

## 2018-02-13 NOTE — Telephone Encounter (Signed)
Rx has been refilled per pt request

## 2018-02-13 NOTE — Telephone Encounter (Signed)
Rx was sent to pt pharmacy as requested. Pt is aware

## 2018-03-01 ENCOUNTER — Other Ambulatory Visit: Payer: Self-pay | Admitting: Gastroenterology

## 2018-03-11 ENCOUNTER — Encounter: Payer: Self-pay | Admitting: Family Medicine

## 2018-03-12 ENCOUNTER — Ambulatory Visit (INDEPENDENT_AMBULATORY_CARE_PROVIDER_SITE_OTHER): Payer: BLUE CROSS/BLUE SHIELD | Admitting: Adult Health

## 2018-03-12 ENCOUNTER — Encounter: Payer: Self-pay | Admitting: Adult Health

## 2018-03-12 ENCOUNTER — Other Ambulatory Visit: Payer: Self-pay | Admitting: Adult Health

## 2018-03-12 VITALS — BP 150/80 | Temp 97.9°F | Wt 123.0 lb

## 2018-03-12 DIAGNOSIS — L309 Dermatitis, unspecified: Secondary | ICD-10-CM | POA: Diagnosis not present

## 2018-03-12 MED ORDER — METHYLPREDNISOLONE 4 MG PO TBPK: ORAL_TABLET | ORAL | 0 refills | Status: DC

## 2018-03-12 NOTE — Telephone Encounter (Signed)
Cory, this medication is on back order.  The pharmacy is requesting a different medication.

## 2018-03-12 NOTE — Progress Notes (Signed)
Subjective:    Patient ID: Ruth Stanley, female    DOB: [birth year], 57 y.o.   MRN: [id number]  HPI  57 year old female who  has a past medical history of Constipation, Hypertension, and Seizures ([place]). she is a patient of Dr. Sherren Mocha who I am seeing today for an acute issue of itching and rash on back and scalp. And low back. She reports that she first noticed her symptoms about 2-3 weeks ago. Symptoms have not become and worse or better over the course of the 2-3 weeks.   She has not changed any soaps, detergents, lotions, or foods.    She denies any travel, sleeping in hotels, or itching between fingers or groin.    Review of Systems See HPI   Past Medical History:  Diagnosis Date  . Constipation   . Hypertension   . Seizures ([place])    last one 06/11/2012     Social History   Socioeconomic History  . Marital status: Married    Spouse name: Not on file  . Number of children: Not on file  . Years of education: Not on file  . Highest education level: Not on file  Occupational History  . Not on file  Social Needs  . Financial resource strain: Not on file  . Food insecurity:    Worry: Not on file    Inability: Not on file  . Transportation needs:    Medical: Not on file    Non-medical: Not on file  Tobacco Use  . Smoking status: Former Smoker    Packs/day: 0.25    Types: Cigarettes    Last attempt to quit: 10/15/2004    Years since quitting: 13.4  . Smokeless tobacco: Never Used  Substance and Sexual Activity  . Alcohol use: Yes    Alcohol/week: 0.0 standard drinks    Comment: occasional wine  . Drug use: No  . Sexual activity: Yes    Birth control/protection: Post-menopausal  Lifestyle  . Physical activity:    Days per week: Not on file    Minutes per session: Not on file  . Stress: Not on file  Relationships  . Social connections:    Talks on phone: Not on file    Gets together: Not on file    Attends religious service: Not on file    Active member of  club or organization: Not on file    Attends meetings of clubs or organizations: Not on file    Relationship status: Not on file  . Intimate partner violence:    Fear of current or ex partner: Not on file    Emotionally abused: Not on file    Physically abused: Not on file    Forced sexual activity: Not on file  Other Topics Concern  . Not on file  Social History Narrative  . Not on file    Past Surgical History:  Procedure Laterality Date  . CEREBRAL ANEURYSM REPAIR  2006  . COLONOSCOPY  07/2009   Ardis Hughs - Hx polyp  . WISDOM TOOTH EXTRACTION      Family History  Problem Relation Age of Onset  . Colon cancer Neg Hx   . Colon polyps Neg Hx   . Esophageal cancer Neg Hx   . Rectal cancer Neg Hx   . Stomach cancer Neg Hx   . Breast cancer Neg Hx     No Known Allergies  Current Outpatient Medications on File Prior to Visit  Medication Sig  Dispense Refill  . aspirin 325 MG tablet Take 325 mg by mouth daily.    . calcium carbonate 200 MG capsule Take 250 mg by mouth 2 (two) times daily with a meal.    . clindamycin (CLEOCIN T) 1 % lotion APPLY TOPICALLY 2 (TWO) TIMES DAILY. 60 mL 0  . clindamycin (CLEOCIN T) 1 % lotion Apply topically 2 (two) times daily. 60 mL 3  . lamoTRIgine (LAMICTAL) 100 MG tablet TAKE 1 TABLET IN THE MORNING AND 1 TABLET IN THE EVENING 180 tablet 4  . lisinopril (PRINIVIL,ZESTRIL) 10 MG tablet Take 1 tablet (10 mg total) by mouth daily. 90 tablet 4  . mesalamine (LIALDA) 1.2 g EC tablet TAKE 4 TABLETS DAILY WITH BREAKFAST 360 tablet 0  . Multiple Vitamins-Minerals (CENTRUM SILVER PO) Take by mouth.     No current facility-administered medications on file prior to visit.     LMP 06/18/2010 (Exact Date)       Objective:   Physical Exam  Constitutional: She is oriented to person, place, and time. She appears well-developed and well-nourished. No distress.  Cardiovascular: Normal rate, regular rhythm, normal heart sounds and intact distal pulses.    Pulmonary/Chest: Effort normal and breath sounds normal.  Neurological: She is alert and oriented to person, place, and time.  Skin: Skin is warm and dry. Rash noted. Rash is papular. She is not diaphoretic. No erythema.     No rash seen on scalp Rash noted on low back, rash is red and papular.   Psychiatric: She has a normal mood and affect. Her behavior is normal. Judgment and thought content normal.  Vitals reviewed.     Assessment & Plan:  1. Dermatitis - Unknown irritant. Does not appear as insect bite, fungal infection, or scabies - methylPREDNISolone (MEDROL DOSEPAK) 4 MG TBPK tablet; Take as directed  Dispense: 21 tablet; Refill: 0 - Follow up if not resolved in the next 4-5 days   Dorothyann Peng, NP

## 2018-03-13 ENCOUNTER — Telehealth: Payer: Self-pay | Admitting: Family Medicine

## 2018-03-13 ENCOUNTER — Other Ambulatory Visit: Payer: Self-pay | Admitting: Adult Health

## 2018-03-13 MED ORDER — PREDNISONE 20 MG PO TABS: 20.0000 mg | ORAL_TABLET | Freq: Every day | ORAL | 0 refills | Status: DC

## 2018-03-13 NOTE — Telephone Encounter (Signed)
Copied from [place] [id number]. Topic: Quick Communication - See Telephone Encounter >> Mar 13, 2018 11:27 AM Blase Mess A wrote: CRM for notification. See Telephone encounter for: 03/13/18. Patient was in yesterday Pharmacy does not have methylPREDNISolone (MEDROL DOSEPAK) 4 MG TBPK tablet [[id number]]  Patient and Pharmacy are requesting a script to be written for an alternative med.  Patient is still itching and would like some relieve soon.  Please advise Patients call back number [phone]  Pharmacy is [id number] [place], [place] - [address] [place] [address]Phone: [phone]: [phone]

## 2018-03-13 NOTE — Telephone Encounter (Signed)
Prednisone sent to pharmacy

## 2018-03-13 NOTE — Telephone Encounter (Signed)
I have attempted to call the pt at the number left, but the VM is full.  Will try back later.

## 2018-03-13 NOTE — Telephone Encounter (Signed)
Ruth Stanley the pt called and stated that the pharmacy does not have the medrol dosepak.  The pt is requesting that an alternative be written for her to get some relief.  Please advise. Thanks

## 2018-03-16 ENCOUNTER — Ambulatory Visit: Payer: BLUE CROSS/BLUE SHIELD | Admitting: Family Medicine

## 2018-03-16 ENCOUNTER — Other Ambulatory Visit: Payer: Self-pay | Admitting: Family Medicine

## 2018-03-16 MED ORDER — PREDNISONE 20 MG PO TABS: ORAL_TABLET | ORAL | 2 refills | Status: DC

## 2018-03-16 NOTE — Telephone Encounter (Signed)
Prednisone was sent to the pts pharmacy.  I have left a message on the machine for the pt to call us back to let us know if she has received the prescription.

## 2018-03-16 NOTE — Telephone Encounter (Signed)
I emailed and a prescription for the individual prednisone tablets

## 2018-03-17 NOTE — Telephone Encounter (Signed)
Pt called back and stated she did get the RX for prednisone. It is helping her and thank you for sending it in.

## 2018-03-17 NOTE — Telephone Encounter (Signed)
Noted  

## 2018-03-25 DIAGNOSIS — L309 Dermatitis, unspecified: Secondary | ICD-10-CM | POA: Diagnosis not present

## 2018-03-31 ENCOUNTER — Other Ambulatory Visit: Payer: Self-pay | Admitting: Family Medicine

## 2018-03-31 DIAGNOSIS — Z1231 Encounter for screening mammogram for malignant neoplasm of breast: Secondary | ICD-10-CM

## 2018-04-02 ENCOUNTER — Other Ambulatory Visit: Payer: Self-pay | Admitting: Family Medicine

## 2018-04-27 ENCOUNTER — Ambulatory Visit
Admission: RE | Admit: 2018-04-27 | Discharge: 2018-04-27 | Disposition: A | Discharge status: Home / Self Care | Payer: BLUE CROSS/BLUE SHIELD | Source: Ambulatory Visit

## 2018-04-27 DIAGNOSIS — L718 Other rosacea: Secondary | ICD-10-CM | POA: Diagnosis not present

## 2018-04-27 DIAGNOSIS — L309 Dermatitis, unspecified: Secondary | ICD-10-CM | POA: Diagnosis not present

## 2018-04-27 DIAGNOSIS — Z1231 Encounter for screening mammogram for malignant neoplasm of breast: Secondary | ICD-10-CM | POA: Diagnosis not present

## 2018-05-11 ENCOUNTER — Encounter: Payer: Self-pay | Admitting: Family Medicine

## 2018-05-11 ENCOUNTER — Ambulatory Visit (INDEPENDENT_AMBULATORY_CARE_PROVIDER_SITE_OTHER): Payer: BLUE CROSS/BLUE SHIELD | Admitting: Family Medicine

## 2018-05-11 VITALS — BP 138/70 | Temp 98.4°F | Ht 63.5 in | Wt 123.8 lb

## 2018-05-11 DIAGNOSIS — Z23 Encounter for immunization: Secondary | ICD-10-CM | POA: Diagnosis not present

## 2018-05-11 DIAGNOSIS — K51919 Ulcerative colitis, unspecified with unspecified complications: Secondary | ICD-10-CM

## 2018-05-11 DIAGNOSIS — Z0000 Encounter for general adult medical examination without abnormal findings: Secondary | ICD-10-CM

## 2018-05-11 DIAGNOSIS — Z8669 Personal history of other diseases of the nervous system and sense organs: Secondary | ICD-10-CM | POA: Diagnosis not present

## 2018-05-11 DIAGNOSIS — I10 Essential (primary) hypertension: Secondary | ICD-10-CM

## 2018-05-11 DIAGNOSIS — L719 Rosacea, unspecified: Secondary | ICD-10-CM

## 2018-05-11 LAB — POCT URINALYSIS DIPSTICK
Bilirubin, UA: NEGATIVE
Blood, UA: NEGATIVE
Glucose, UA: NEGATIVE
Ketones, UA: NEGATIVE
Leukocytes, UA: NEGATIVE
Nitrite, UA: NEGATIVE
Odor: NEGATIVE
Protein, UA: NEGATIVE
Spec Grav, UA: 1.015 (ref 1.010–1.025)
Urobilinogen, UA: 0.2 E.U./dL
pH, UA: 5 (ref 5.0–8.0)

## 2018-05-11 LAB — CBC WITH DIFFERENTIAL/PLATELET
Basophils Absolute: 0 10*3/uL (ref 0.0–0.1)
Basophils Relative: 0.8 % (ref 0.0–3.0)
Eosinophils Absolute: 0.1 10*3/uL (ref 0.0–0.7)
Eosinophils Relative: 2.5 % (ref 0.0–5.0)
HCT: 35.8 % — ABNORMAL LOW (ref 36.0–46.0)
Hemoglobin: 11.8 g/dL — ABNORMAL LOW (ref 12.0–15.0)
Lymphocytes Relative: 22.2 % (ref 12.0–46.0)
Lymphs Abs: 1.2 10*3/uL (ref 0.7–4.0)
MCHC: 33 g/dL (ref 30.0–36.0)
MCV: 83.8 fl (ref 78.0–100.0)
Monocytes Absolute: 0.4 10*3/uL (ref 0.1–1.0)
Monocytes Relative: 7.3 % (ref 3.0–12.0)
Neutro Abs: 3.7 10*3/uL (ref 1.4–7.7)
Neutrophils Relative %: 67.2 % (ref 43.0–77.0)
Platelets: 308 10*3/uL (ref 150.0–400.0)
RBC: 4.27 Mil/uL (ref 3.87–5.11)
RDW: 14 % (ref 11.5–15.5)
WBC: 5.5 10*3/uL (ref 4.0–10.5)

## 2018-05-11 LAB — HEPATIC FUNCTION PANEL
ALT: 15 U/L (ref 0–35)
AST: 20 U/L (ref 0–37)
Albumin: 4.4 g/dL (ref 3.5–5.2)
Alkaline Phosphatase: 50 U/L (ref 39–117)
Bilirubin, Direct: 0.1 mg/dL (ref 0.0–0.3)
Total Bilirubin: 0.9 mg/dL (ref 0.2–1.2)
Total Protein: 7 g/dL (ref 6.0–8.3)

## 2018-05-11 LAB — LIPID PANEL
Cholesterol: 199 mg/dL (ref 0–200)
HDL: 62.4 mg/dL (ref >39.00)
LDL Cholesterol: 121 mg/dL — ABNORMAL HIGH (ref 0–99)
NonHDL: 136.86
Total CHOL/HDL Ratio: 3
Triglycerides: 77 mg/dL (ref 0.0–149.0)
VLDL: 15.4 mg/dL (ref 0.0–40.0)

## 2018-05-11 LAB — BASIC METABOLIC PANEL
BUN: 12 mg/dL (ref 6–23)
CO2: 28 mEq/L (ref 19–32)
Calcium: 9.7 mg/dL (ref 8.4–10.5)
Chloride: 103 mEq/L (ref 96–112)
Creatinine, Ser: 0.82 mg/dL (ref 0.40–1.20)
GFR: 75.62 mL/min (ref >60.00)
Glucose, Bld: 84 mg/dL (ref 70–99)
Potassium: 4.5 mEq/L (ref 3.5–5.1)
Sodium: 139 mEq/L (ref 135–145)

## 2018-05-11 LAB — TSH: TSH: 1 u[IU]/mL (ref 0.35–4.50)

## 2018-05-11 MED ORDER — ESTROGENS, CONJUGATED 0.625 MG/GM VA CREA: TOPICAL_CREAM | VAGINAL | 12 refills | Status: DC

## 2018-05-11 MED ORDER — DOXYCYCLINE HYCLATE 100 MG PO TABS: 100.0000 mg | ORAL_TABLET | Freq: Two times a day (BID) | ORAL | 4 refills | Status: DC

## 2018-05-11 MED ORDER — LISINOPRIL 10 MG PO TABS: 10.0000 mg | ORAL_TABLET | Freq: Every day | ORAL | 4 refills | Status: DC

## 2018-05-11 MED ORDER — CLINDAMYCIN PHOSPHATE 1 % EX LOTN: TOPICAL_LOTION | Freq: Two times a day (BID) | CUTANEOUS | 6 refills | Status: DC

## 2018-05-11 MED ORDER — LAMOTRIGINE 100 MG PO TABS: ORAL_TABLET | ORAL | 4 refills | Status: DC

## 2018-05-11 NOTE — Progress Notes (Signed)
Ruth Stanley is a delightful 57-year-old married female non-smoker who comes in today for annual physical examination because of the following issues  She has a history of underlying hypertension she is on lisinopril 10 mg daily BP normal,,,,,,,,,, 138/72  She has a history of ulcerative colitis.  She is on mesalamine 1.2 g ........... 4 tablets daily breakfast.  She is clinically asymptomatic.  She is having difficulty swallowing the pills.  She would like to try to get off of it.  I advised her that she would like to do that to taper slowly by decreasing the dose by 1 pill every 30 days.  I also asked her to talk to her GI person to get their advice  She has a history of CNS aneurysm repair in 2006.  Since that time she is been on Lamictal 100 mg twice daily she is done well and has had no problems.  She uses Cleocin T for rosacea recently has had a flare.  Offered see doxycycline to take in the winter.  She gets routine eye care, dental care, BSE monthly, annual mammography, colonoscopy done in 2017.  She was told to go back in 5 years because of a history of ulcerative colitis.  Vaccinations up-to-date seasonal flu shot given today  Pap smear done a year ago was normal.  Since she is asymptomatic exam no history of any difficulty in her Paps would recommend every 3 years for Pap smears.  She has no GYN complaints no bleeding etc.  She does have trouble with dryness of her vagina.  We discussed options.  She like to try some PVC cream.  Social history........ married lives here in [place] now spends a lot of time with HER-2 grandchildren.  14 point review of system reviewed otherwise negative  BP 138/70 (BP Location: Right Arm, Patient Position: Sitting, Cuff Size: Normal)   Temp 98.4 F (36.9 C) (Oral)   Ht 5' 3.5" (1.613 m)   Wt 123 lb 12.8 oz (56.2 kg)   LMP 06/18/2010 (Exact Date)   BMI 21.59 kg/m  Well-developed well-nourished female no acute distress vital signs stable she is  afebrile examination HEENT were negative neck was supple thyroid is not enlarged.  Cardiopulmonary exam was normal breast exam was normal.  Abdominal exam was normal.  Extremities normal skin normal peripheral pulses normal except for flare of her rosacea  1.  Healthy female  2.  History of CNS aneurysm repair 2006............ continue Lamictal  3.  History of ulcerative colitis......... try tapering by 1 every 30 days....... consult with gastroenterologist for confirmation  4.  Rosacea....... continue Cleocin T doxycycline  5.  Hypertension at goal........ continue current therapy  6.  Postmenopausal vaginal dryness,,,,,,,,,, begin Premarin vaginal cream   

## 2018-05-11 NOTE — Patient Instructions (Signed)
Labs today.......... I will call if there is anything abnormal  Continue current meds  If you would like to consider tapering your GI medication then I would decrease by 1/month but also notify your gastroenterologist of what you are doing  Doxycycline 100 mg twice daily for 7 to 10 days when the rosacea flares.  But only do this in the winter because you cannot get any sun on your skin  Thank you for coming to see me all these years.......Marland Kitchen good luck to you and your husband and your family in the future

## 2018-05-12 DIAGNOSIS — I10 Essential (primary) hypertension: Secondary | ICD-10-CM | POA: Diagnosis not present

## 2018-05-12 DIAGNOSIS — Z23 Encounter for immunization: Secondary | ICD-10-CM | POA: Diagnosis not present

## 2018-05-31 ENCOUNTER — Other Ambulatory Visit: Payer: Self-pay | Admitting: Gastroenterology

## 2018-06-10 ENCOUNTER — Encounter: Payer: Self-pay | Admitting: Nurse Practitioner

## 2018-06-10 ENCOUNTER — Ambulatory Visit (INDEPENDENT_AMBULATORY_CARE_PROVIDER_SITE_OTHER): Payer: BLUE CROSS/BLUE SHIELD | Admitting: Nurse Practitioner

## 2018-06-10 VITALS — BP 138/78 | HR 86 | Ht 63.5 in | Wt 125.5 lb

## 2018-06-10 DIAGNOSIS — K51219 Ulcerative (chronic) proctitis with unspecified complications: Secondary | ICD-10-CM | POA: Diagnosis not present

## 2018-06-10 NOTE — Patient Instructions (Signed)
If you are age 59 or older, your body mass index should be between 23-30. Your Body mass index is 21.88 kg/m. If this is out of the aforementioned range listed, please consider follow up with your Primary Care Provider.  If you are age 7 or younger, your body mass index should be between 19-25. Your Body mass index is 21.88 kg/m. If this is out of the aformentioned range listed, please consider follow up with your Primary Care Provider.   Call Beth, our nurse with information on Mesalamine.  Thank you for choosing me and [facility].   Tye Savoy, NP

## 2018-06-11 ENCOUNTER — Telehealth: Payer: Self-pay | Admitting: Nurse Practitioner

## 2018-06-11 NOTE — Telephone Encounter (Signed)
Ruth Stanley This very nice patient calls with the names of medications the pharmacist suggested.  Apriso which she says you prescribed last time.Sulfasalazine.Balsalazide.  These will likely all need PA which Peter Congo would do.

## 2018-06-12 ENCOUNTER — Encounter: Payer: Self-pay | Admitting: Nurse Practitioner

## 2018-06-12 NOTE — Telephone Encounter (Signed)
The pharmacist does not know what the insurance will cover until it is run through as a prescription. I am understanding that  was not done.

## 2018-06-12 NOTE — Telephone Encounter (Signed)
Thanks [employer], Just to clarify, patient didn't understand that [facility] was the pharmacy filling her medications but her insurance company would need to be the one to say what is actually covered. I'm assuming the pharmacist checked with her insurance company and the ones you listed are the ones they will pay for? Thanks

## 2018-06-12 NOTE — Progress Notes (Signed)
Chief Complaint:   Wants to change to different UC med   IMPRESSION and PLAN:    57 yo female with UC , diagnosed 2017 after presenting with bloody stool. Doing well on Lialda 4.8 grams daily but has significant difficulty swallowing the large tablets. Asking for alternative.  -first, I checked and Lialda cannot be broken / crushed.  -Apriso caps are smaller so this is an option if her insurance will pay for.  Patient is going to call her insurance company to see which brand of mesalamine is on formulary. She will call to let me know   HPI:     Patient is a 57 year old female diagnosed with left-sided ulcerative colitis February 2017.  She has been maintained on Lialda 4.8 grams daily. Patient last seen here in May 2017. She has been doing very well from UC standpoint. BMs are normal. No blood in stool. No abdominal pain. Her only complaint ( and reason for visit) is to inquire about changing from Lialda to a smaller pill. She has difficulty swallowing the large tablets, feels like they get stuck in her throat. No difficulty swallowing solid food.   Review of systems:     No chest pain, no SOB, no fevers, no urinary sx   Past Medical History:  Diagnosis Date  . Constipation   . Hypertension   . Seizures ([place])    last one 06/11/2012     Patient's surgical history, family medical history, social history, medications and allergies were all reviewed in Epic   Creatinine clearance cannot be calculated (Patient's most recent lab result is older than the maximum 21 days allowed.)  Current Outpatient Medications  Medication Sig Dispense Refill  . aspirin 325 MG tablet Take 325 mg by mouth daily.    . calcium carbonate 200 MG capsule Take 250 mg by mouth 2 (two) times daily with a meal.    . clindamycin (CLEOCIN T) 1 % lotion Apply topically 2 (two) times daily. 60 mL 3  . clindamycin (CLEOCIN T) 1 % lotion Apply topically 2 (two) times daily. 60 mL 6  . conjugated estrogens  (PREMARIN) vaginal cream 1 applicator 3 times weekly 42.5 g 12  . doxycycline (VIBRA-TABS) 100 MG tablet Take 1 tablet (100 mg total) by mouth 2 (two) times daily. 20 tablet 4  . lamoTRIgine (LAMICTAL) 100 MG tablet TAKE 1 TABLET IN THE MORNING AND 1 TABLET IN THE EVENING 180 tablet 4  . lisinopril (PRINIVIL,ZESTRIL) 10 MG tablet Take 1 tablet (10 mg total) by mouth daily. 90 tablet 4  . mesalamine (LIALDA) 1.2 g EC tablet TAKE 4 TABLETS DAILY WITH BREAKFAST 120 tablet 4  . Multiple Vitamins-Minerals (CENTRUM SILVER PO) Take by mouth.     No current facility-administered medications for this visit.     Physical Exam:     BP 138/78   Pulse 86   Ht 5' 3.5" (1.613 m)   Wt 125 lb 8 oz (56.9 kg)   LMP 06/18/2010 (Exact Date)   SpO2 98%   BMI 21.88 kg/m   GENERAL:  Pleasant female in NAD PSYCH: : Cooperative, normal affect EENT:  conjunctiva pink, mucous membranes moist, neck supple without masses CARDIAC:  RRR, no murmur heard, no peripheral edema PULM: Normal respiratory effort, lungs CTA bilaterally, no wheezing ABDOMEN:  Nondistended, soft, nontender. No obvious masses, no hepatomegaly,  normal bowel sounds SKIN:  turgor, no lesions seen Musculoskeletal:  Normal muscle tone, normal strength NEURO: Alert and  oriented x 3, no focal neurologic deficits   Tye Savoy , NP 06/12/2018, 11:22 AM

## 2018-06-15 NOTE — Progress Notes (Signed)
I agree with the above note, plan.    Patty, She needs rov in 12 months.  thanks

## 2018-06-16 NOTE — Progress Notes (Signed)
Ruth Stanley, Dr. Ardis Hughs signed my note, he did not seem to have a problem with her changing to [name]. Please let her know that we will call this into [facility] and hopefully insurance will pay. I will ask Peter Congo to send in Rx. Thanks

## 2018-06-19 ENCOUNTER — Other Ambulatory Visit: Payer: Self-pay

## 2018-06-19 MED ORDER — MESALAMINE ER 0.375 G PO CP24: ORAL_CAPSULE | ORAL | 11 refills | Status: DC

## 2018-10-06 ENCOUNTER — Other Ambulatory Visit: Payer: Self-pay | Admitting: Family Medicine

## 2018-10-06 NOTE — Telephone Encounter (Signed)
Pt needs refill of topical Clindamycin- called pt and left voice mail to make a TOC appt. Routing to office to review.

## 2018-10-07 MED ORDER — CLINDAMYCIN PHOSPHATE 1 % EX LOTN: TOPICAL_LOTION | Freq: Two times a day (BID) | CUTANEOUS | 2 refills | Status: DC

## 2018-10-07 NOTE — Telephone Encounter (Signed)
Patient is aware of TOC.  She says she has talked to [facility] at an office visit and was [facility] as a new patient.  If [name] can not accept her then she is okay with any other provider except Dr Ethlyn Gallery.  Patient is okay with a WebEx visit.  Patient would like a call back.

## 2018-10-07 NOTE — Telephone Encounter (Signed)
Sent to the pharmacy by [facility]. 

## 2018-10-07 NOTE — Telephone Encounter (Signed)
Ok to refill.   We can take her as a patient.

## 2018-10-08 ENCOUNTER — Other Ambulatory Visit: Payer: Self-pay

## 2018-10-08 ENCOUNTER — Ambulatory Visit (INDEPENDENT_AMBULATORY_CARE_PROVIDER_SITE_OTHER): Payer: 59 | Admitting: Adult Health

## 2018-10-08 ENCOUNTER — Encounter: Payer: Self-pay | Admitting: Adult Health

## 2018-10-08 DIAGNOSIS — K51919 Ulcerative colitis, unspecified with unspecified complications: Secondary | ICD-10-CM | POA: Diagnosis not present

## 2018-10-08 DIAGNOSIS — I10 Essential (primary) hypertension: Secondary | ICD-10-CM

## 2018-10-08 DIAGNOSIS — L719 Rosacea, unspecified: Secondary | ICD-10-CM

## 2018-10-08 DIAGNOSIS — I671 Cerebral aneurysm, nonruptured: Secondary | ICD-10-CM | POA: Diagnosis not present

## 2018-10-08 DIAGNOSIS — Z7689 Persons encountering health services in other specified circumstances: Secondary | ICD-10-CM | POA: Diagnosis not present

## 2018-10-08 NOTE — Progress Notes (Signed)
Virtual Visit via Video Note  I connected with Ruth Stanley on 10/08/18 at  8:30 AM EDT by a video enabled telemedicine application and verified that I am speaking with the correct person using two identifiers.  Location patient: home Location provider:work or home office Persons participating in the virtual visit: patient, provider  I discussed the limitations of evaluation and management by telemedicine and the availability of in person appointments. The patient expressed understanding and agreed to proceed.   She is a pleasant 58 year old female who  has a past medical history of Constipation, Hypertension, and Seizures ([place]).  She is a former patient of Dr. Sherren Mocha. She last had a CPE in 05/2018   Acute Concerns: Establish Care  Chronic Issues: Essential Hypertension - takes lisinopril 10 mg daily. No headaches, blurred vision, chest pain, SOB.  BP Readings from Last 3 Encounters:  06/10/18 138/78  05/11/18 138/70  03/12/18 (!) 150/80   Ulcerative Colitis -diagnosed in February 2017, managed by GI.  Currently prescribed Apriso - 4 tabs daily.  She is asymptomatic.  Reports that she stopped taking Apriso in December and has not had any symptoms   H/o CNS aneurysm repair in 2006.  She has been on Lamictal 100 mg twice daily since this time.  Reports no issues or problems  Rosacea-uses Cleocin T daily and then switches to doxycycline during the winter  Health Maintenance: Dental --routine Vision --Routine Immunizations --up-to-date Colonoscopy --up-to-date, last was in 2017 she is on the 5-year plan due to history of ulcerative colitis Mammogram --up-to-date PAP --up to date Bone Density -- never had   Past Medical History:  Diagnosis Date  . Constipation   . Hypertension   . Seizures ([place])    last one 06/11/2012     Past Surgical History:  Procedure Laterality Date  . CEREBRAL ANEURYSM REPAIR  2006  . COLONOSCOPY  07/2009   Ardis Hughs - Hx polyp  . WISDOM TOOTH  EXTRACTION      Current Outpatient Medications on File Prior to Visit  Medication Sig Dispense Refill  . aspirin 325 MG tablet Take 325 mg by mouth daily.    . calcium carbonate 200 MG capsule Take 250 mg by mouth 2 (two) times daily with a meal.    . clindamycin (CLEOCIN T) 1 % lotion Apply topically 2 (two) times daily. 60 mL 3  . clindamycin (CLEOCIN T) 1 % lotion Apply topically 2 (two) times daily. 60 mL 2  . conjugated estrogens (PREMARIN) vaginal cream 1 applicator 3 times weekly 42.5 g 12  . doxycycline (VIBRA-TABS) 100 MG tablet Take 1 tablet (100 mg total) by mouth 2 (two) times daily. 20 tablet 4  . lamoTRIgine (LAMICTAL) 100 MG tablet TAKE 1 TABLET IN THE MORNING AND 1 TABLET IN THE EVENING 180 tablet 4  . lisinopril (PRINIVIL,ZESTRIL) 10 MG tablet Take 1 tablet (10 mg total) by mouth daily. 90 tablet 4  . mesalamine (APRISO) 0.375 g 24 hr capsule Take 4 daily 120 capsule 11  . Multiple Vitamins-Minerals (CENTRUM SILVER PO) Take by mouth.     No current facility-administered medications on file prior to visit.     No Known Allergies  Family History  Problem Relation Age of Onset  . Colon cancer Neg Hx   . Colon polyps Neg Hx   . Esophageal cancer Neg Hx   . Rectal cancer Neg Hx   . Stomach cancer Neg Hx   . Breast cancer Neg Hx  Social History   Socioeconomic History  . Marital status: Married    Spouse name: Not on file  . Number of children: Not on file  . Years of education: Not on file  . Highest education level: Not on file  Occupational History  . Not on file  Social Needs  . Financial resource strain: Not on file  . Food insecurity:    Worry: Not on file    Inability: Not on file  . Transportation needs:    Medical: Not on file    Non-medical: Not on file  Tobacco Use  . Smoking status: Former Smoker    Packs/day: 0.25    Types: Cigarettes    Last attempt to quit: 10/15/2004    Years since quitting: 13.9  . Smokeless tobacco: Never Used   Substance and Sexual Activity  . Alcohol use: Yes    Alcohol/week: 0.0 standard drinks    Comment: occasional wine  . Drug use: No  . Sexual activity: Yes    Birth control/protection: Post-menopausal  Lifestyle  . Physical activity:    Days per week: Not on file    Minutes per session: Not on file  . Stress: Not on file  Relationships  . Social connections:    Talks on phone: Not on file    Gets together: Not on file    Attends religious service: Not on file    Active member of club or organization: Not on file    Attends meetings of clubs or organizations: Not on file    Relationship status: Not on file  . Intimate partner violence:    Fear of current or ex partner: Not on file    Emotionally abused: Not on file    Physically abused: Not on file    Forced sexual activity: Not on file  Other Topics Concern  . Not on file  Social History Narrative  . Not on file    Review of Systems  Constitutional: Negative.   HENT: Negative.   Eyes: Negative.   Respiratory: Negative.   Cardiovascular: Negative.   Gastrointestinal: Negative.   Genitourinary: Negative.   Musculoskeletal: Negative.   Skin: Negative.   Neurological: Negative.   Endo/Heme/Allergies: Negative.   Psychiatric/Behavioral: Negative.       LMP 06/18/2010 (Exact Date)   Physical Exam   VITALS per patient if applicable:  GENERAL: alert, oriented, appears well and in no acute distress  HEENT: atraumatic, conjunttiva clear, no obvious abnormalities on inspection of external nose and ears  NECK: normal movements of the head and neck  LUNGS: on inspection no signs of respiratory distress, breathing rate appears normal, no obvious gross SOB, gasping or wheezing  CV: no obvious cyanosis  MS: moves all visible extremities without noticeable abnormality  PSYCH/NEURO: pleasant and cooperative, no obvious depression or anxiety, speech and thought processing grossly intact     Assessment/Plan: 1.  Encounter to establish care - Follow up in November for CPE  - Follow up sooner if needed  2. Essential hypertension - Continue with lisinopril   3. Cerebral aneurysm - Stable   4. Ulcerative colitis with complication, unspecified location ([place]) - Stable w/o medication   5. Rosacea - Stable   I discussed the assessment and treatment plan with the patient. The patient was provided an opportunity to ask questions and all were answered. The patient agreed with the plan and demonstrated an understanding of the instructions.   The patient was advised to call back or seek  an in-person evaluation if the symptoms worsen or if the condition fails to improve as anticipated.   Dorothyann Peng, NP

## 2018-10-26 ENCOUNTER — Other Ambulatory Visit: Payer: Self-pay | Admitting: Family Medicine

## 2018-10-26 MED ORDER — LISINOPRIL 10 MG PO TABS: 10.0000 mg | ORAL_TABLET | Freq: Every day | ORAL | 2 refills | Status: DC

## 2018-10-26 NOTE — Telephone Encounter (Signed)
[name], are you filling this medication?

## 2018-10-27 MED ORDER — LAMOTRIGINE 100 MG PO TABS: ORAL_TABLET | ORAL | 3 refills | Status: DC

## 2018-10-27 NOTE — Telephone Encounter (Signed)
Sent to the pharmacy by [facility]. 

## 2018-10-27 NOTE — Telephone Encounter (Signed)
We will be filling this. OK to refill for one year

## 2019-04-06 ENCOUNTER — Other Ambulatory Visit: Payer: Self-pay | Admitting: Adult Health

## 2019-04-06 ENCOUNTER — Encounter: Payer: Self-pay | Admitting: Adult Health

## 2019-04-06 MED ORDER — CLINDAMYCIN PHOSPHATE 1 % EX LOTN: TOPICAL_LOTION | Freq: Two times a day (BID) | CUTANEOUS | 0 refills | Status: DC

## 2019-04-06 MED ORDER — LISINOPRIL 10 MG PO TABS: 10.0000 mg | ORAL_TABLET | Freq: Every day | ORAL | 0 refills | Status: DC

## 2019-04-06 NOTE — Telephone Encounter (Signed)
Medication Refill - Medication: lamoTRIgine (LAMICTAL) 100 MG tablet, lisinopril (ZESTRIL) 10 MG tablet, clindamycin (CLEOCIN T) 1 % lotion      Has the patient contacted their pharmacy? No. (Agent: If no, request that the patient contact the pharmacy for the refill.) (Agent: If yes, when and what did the pharmacy advise?)  Preferred Pharmacy (with phone number or street name):  [facility] # [id number] [place]  [address] [place] S[place] [address]  Phone: [phone] Fax: [phone]  Not a 24 hour pharmacy; exact hours not known.     Agent: Please be advised that RX refills may take up to 3 business days. We ask that you follow-up with your pharmacy.

## 2019-04-06 NOTE — Telephone Encounter (Signed)
Requested medication (s) are due for refill today: yes  Requested medication (s) are on the active medication list: yes  Last refill:  10/26/2018  Future visit scheduled: no  Notes to clinic:  Review for refill   Requested Prescriptions  Pending Prescriptions Disp Refills   clindamycin (CLEOCIN T) 1 % lotion 60 mL 2    Sig: Apply topically 2 (two) times daily.     Off-Protocol Failed - 04/06/2019 10:09 AM      Failed - Medication not assigned to a protocol, review manually.      Passed - Valid encounter within last 12 months    Recent Outpatient Visits          6 months ago Essential hypertension   Therapist, music at [facility], [place], NP   11 months ago Essential hypertension   Therapist, music at [facility], Jory Ee, MD   1 year ago [place] [facility], [place], NP   1 year ago Other ulcerative colitis with rectal bleeding ([place])   Therapist, music at [facility], Jory Ee, MD   2 years ago Essential hypertension   Therapist, music at [facility], Dellis Filbert A, MD              lisinopril (ZESTRIL) 10 MG tablet 90 tablet 2    Sig: Take 1 tablet (10 mg total) by mouth daily.     Cardiovascular:  ACE Inhibitors Failed - 04/06/2019 10:09 AM      Failed - Cr in normal range and within 180 days    Creatinine, Ser  Date Value Ref Range Status  05/11/2018 0.82 0.40 - 1.20 mg/dL Final         Failed - K in normal range and within 180 days    Potassium  Date Value Ref Range Status  05/11/2018 4.5 3.5 - 5.1 mEq/L Final         Passed - Patient is not pregnant      Passed - Last BP in normal range    BP Readings from Last 1 Encounters:  06/10/18 138/78         Passed - Valid encounter within last 6 months    Recent Outpatient Visits          6 months ago Essential hypertension   Therapist, music at [facility], [place], NP   11 months ago Essential hypertension   Therapist, music at  [facility], Jory Ee, MD   1 year ago [place] [facility], [place], NP   1 year ago Other ulcerative colitis with rectal bleeding ([place])   Therapist, music at [facility], Jory Ee, MD   2 years ago Essential hypertension   Therapist, music at [facility], Jory Ee, MD

## 2019-04-06 NOTE — Telephone Encounter (Signed)
Ok for refills to [facility]

## 2019-04-06 NOTE — Telephone Encounter (Signed)
Sent to [facility] on 10/26/2018 for 9 months.  Left a message for the pt to return my call.  This request is from local pharmacy.  Need to find out what pharmacy she is using.

## 2019-04-06 NOTE — Telephone Encounter (Signed)
Pt states that her insurance changed and she can't use [facility] anymore and needs these transferred to the [facility] on [address], but they will not transfer from mail order to local pharmacy. Pt is now using [facility] # [id number] [place] [phone] (Phone) [phone] (Fax)

## 2019-04-07 ENCOUNTER — Other Ambulatory Visit: Payer: Self-pay | Admitting: Adult Health

## 2019-04-07 DIAGNOSIS — Z1231 Encounter for screening mammogram for malignant neoplasm of breast: Secondary | ICD-10-CM

## 2019-04-07 MED ORDER — LAMOTRIGINE 100 MG PO TABS: ORAL_TABLET | ORAL | 0 refills | Status: DC

## 2019-06-14 ENCOUNTER — Other Ambulatory Visit: Payer: Self-pay

## 2019-06-14 ENCOUNTER — Ambulatory Visit
Admission: RE | Admit: 2019-06-14 | Discharge: 2019-06-14 | Disposition: A | Discharge status: Home / Self Care | Payer: 59 | Source: Ambulatory Visit | Attending: Adult Health | Admitting: Adult Health

## 2019-06-14 DIAGNOSIS — Z1231 Encounter for screening mammogram for malignant neoplasm of breast: Secondary | ICD-10-CM

## 2019-07-04 ENCOUNTER — Other Ambulatory Visit: Payer: Self-pay | Admitting: Adult Health

## 2019-08-04 ENCOUNTER — Other Ambulatory Visit: Payer: Self-pay | Admitting: Adult Health

## 2019-10-28 ENCOUNTER — Other Ambulatory Visit: Payer: Self-pay

## 2019-10-29 ENCOUNTER — Ambulatory Visit (INDEPENDENT_AMBULATORY_CARE_PROVIDER_SITE_OTHER): Payer: 59 | Admitting: Adult Health

## 2019-10-29 VITALS — BP 168/94 | Temp 96.8°F | Ht 63.5 in | Wt 124.0 lb

## 2019-10-29 DIAGNOSIS — K51919 Ulcerative colitis, unspecified with unspecified complications: Secondary | ICD-10-CM

## 2019-10-29 DIAGNOSIS — I671 Cerebral aneurysm, nonruptured: Secondary | ICD-10-CM

## 2019-10-29 DIAGNOSIS — I10 Essential (primary) hypertension: Secondary | ICD-10-CM | POA: Diagnosis not present

## 2019-10-29 DIAGNOSIS — Z0000 Encounter for general adult medical examination without abnormal findings: Secondary | ICD-10-CM

## 2019-10-29 DIAGNOSIS — L299 Pruritus, unspecified: Secondary | ICD-10-CM

## 2019-10-29 DIAGNOSIS — L719 Rosacea, unspecified: Secondary | ICD-10-CM

## 2019-10-29 LAB — CBC WITH DIFFERENTIAL/PLATELET
Basophils Absolute: 0.1 10*3/uL (ref 0.0–0.1)
Basophils Relative: 1.1 % (ref 0.0–3.0)
Eosinophils Absolute: 0.1 10*3/uL (ref 0.0–0.7)
Eosinophils Relative: 2.8 % (ref 0.0–5.0)
HCT: 28.9 % — ABNORMAL LOW (ref 36.0–46.0)
Hemoglobin: 9.5 g/dL — ABNORMAL LOW (ref 12.0–15.0)
Lymphocytes Relative: 13.6 % (ref 12.0–46.0)
Lymphs Abs: 0.7 10*3/uL (ref 0.7–4.0)
MCHC: 32.9 g/dL (ref 30.0–36.0)
MCV: 81.6 fl (ref 78.0–100.0)
Monocytes Absolute: 0.4 10*3/uL (ref 0.1–1.0)
Monocytes Relative: 8.4 % (ref 3.0–12.0)
Neutro Abs: 3.6 10*3/uL (ref 1.4–7.7)
Neutrophils Relative %: 74.1 % (ref 43.0–77.0)
Platelets: 466 10*3/uL — ABNORMAL HIGH (ref 150.0–400.0)
RBC: 3.54 Mil/uL — ABNORMAL LOW (ref 3.87–5.11)
RDW: 13.8 % (ref 11.5–15.5)
WBC: 4.9 10*3/uL (ref 4.0–10.5)

## 2019-10-29 LAB — COMPREHENSIVE METABOLIC PANEL
ALT: 13 U/L (ref 0–35)
AST: 19 U/L (ref 0–37)
Albumin: 3.7 g/dL (ref 3.5–5.2)
Alkaline Phosphatase: 57 U/L (ref 39–117)
BUN: 10 mg/dL (ref 6–23)
CO2: 28 mEq/L (ref 19–32)
Calcium: 8.8 mg/dL (ref 8.4–10.5)
Chloride: 106 mEq/L (ref 96–112)
Creatinine, Ser: 0.6 mg/dL (ref 0.40–1.20)
GFR: 101.53 mL/min (ref >60.00)
Glucose, Bld: 91 mg/dL (ref 70–99)
Potassium: 4.5 mEq/L (ref 3.5–5.1)
Sodium: 140 mEq/L (ref 135–145)
Total Bilirubin: 0.8 mg/dL (ref 0.2–1.2)
Total Protein: 6.5 g/dL (ref 6.0–8.3)

## 2019-10-29 LAB — LIPID PANEL
Cholesterol: 176 mg/dL (ref 0–200)
HDL: 44.9 mg/dL (ref >39.00)
LDL Cholesterol: 107 mg/dL — ABNORMAL HIGH (ref 0–99)
NonHDL: 131.42
Total CHOL/HDL Ratio: 4
Triglycerides: 122 mg/dL (ref 0.0–149.0)
VLDL: 24.4 mg/dL (ref 0.0–40.0)

## 2019-10-29 LAB — TSH: TSH: 1.02 u[IU]/mL (ref 0.35–4.50)

## 2019-10-29 MED ORDER — TRIAMCINOLONE ACETONIDE 0.5 % EX OINT: 1.0000 "application " | TOPICAL_OINTMENT | Freq: Two times a day (BID) | CUTANEOUS | 1 refills | Status: DC

## 2019-10-29 MED ORDER — LAMOTRIGINE 100 MG PO TABS: ORAL_TABLET | ORAL | 3 refills | Status: DC

## 2019-10-29 MED ORDER — LISINOPRIL 10 MG PO TABS: 10.0000 mg | ORAL_TABLET | Freq: Every day | ORAL | 3 refills | Status: DC

## 2019-10-29 NOTE — Progress Notes (Signed)
Subjective:    Patient ID: Ruth Stanley, female    DOB: 1960-07-21, 59 y.o.   MRN: [id number]  HPI Patient presents for yearly preventative medicine examination. She is a pleasant 59 year old female who  has a past medical history of Constipation, Hypertension, and Seizures ([place]).   Essential Hypertension -currently prescribed lisinopril 10 mg daily.  She reports that she has not taken her medication since sometime in December 2020.  She denies headaches, blurred vision, chest pain, or shortness of breath.  BP Readings from Last 3 Encounters:  10/29/19 (!) 168/94  06/10/18 138/78  05/11/18 138/70    Ulcerative colitis-diagnosed in February 2017 and is managed by GI.  She was prescribed Apriso but stopped taking this is 2020.  She has not followed up with gastroenterology since her last visit in 2019.  She believes that she may have had a flare recently who went back to taking her medication that she had leftover.  Currently no acute complaints.  H/o CNS aneurysm repair in 2006-she has been on Lamictal 100 mg twice daily since this time.  She reports no issues but has not been taking her medications routinely  Pruritus-this is an acute issue today.  She has had itching under her right breast as well as on her right lower leg.  She has been using prednisone that she was prescribed by her previous PCP.  Does not seem to be helping much.  Denies rash.  He does endorse episodes of increased warmness on her right lower leg.  No calf pain or tenderness noted  Rosacea-uses Cleocin T daily except for the wintertime where she uses doxycycline.  All immunizations and health maintenance protocols were reviewed with the patient and needed orders were placed. UTD on vaccinations   Appropriate screening laboratory values were ordered for the patient including screening of hyperlipidemia, renal function and hepatic function.   Medication reconciliation,  past medical history, social history,  problem list and allergies were reviewed in detail with the patient  Goals were established with regard to weight loss, exercise, and  diet in compliance with medications  Wt Readings from Last 3 Encounters:  10/29/19 124 lb (56.2 kg)  06/10/18 125 lb 8 oz (56.9 kg)  05/11/18 123 lb 12.8 oz (56.2 kg)   She is up-to-date on routine screening colonoscopies.  She is also up-to-date on mammogram.  Due for Pap smear but will follow up with GYN for this   Review of Systems  Constitutional: Negative.   HENT: Negative.   Eyes: Negative.   Respiratory: Negative.   Cardiovascular: Negative.   Gastrointestinal: Negative.   Endocrine: Negative.   Genitourinary: Negative.   Musculoskeletal: Negative.   Skin: Negative.        pruritus  Allergic/Immunologic: Negative.   Neurological: Negative.   Hematological: Negative.   Psychiatric/Behavioral: Negative.      Past Medical History:  Diagnosis Date  . Constipation   . Hypertension   . Seizures ([place])    last one 06/11/2012     Social History   Socioeconomic History  . Marital status: Married    Spouse name: Not on file  . Number of children: Not on file  . Years of education: Not on file  . Highest education level: Not on file  Occupational History  . Not on file  Tobacco Use  . Smoking status: Former Smoker    Packs/day: 0.25    Types: Cigarettes    Quit date: 10/15/2004  Years since quitting: 15.0  . Smokeless tobacco: Never Used  Substance and Sexual Activity  . Alcohol use: Yes    Alcohol/week: 0.0 standard drinks    Comment: occasional wine  . Drug use: No  . Sexual activity: Yes    Birth control/protection: Post-menopausal  Other Topics Concern  . Not on file  Social History Narrative  . Not on file   Social Determinants of Health   Financial Resource Strain:   . Difficulty of Paying Living Expenses:   Food Insecurity:   . Worried About Charity fundraiser in the Last Year:   . Arboriculturist in the Last  Year:   Transportation Needs:   . Film/video editor (Medical):   Marland Kitchen Lack of Transportation (Non-Medical):   Physical Activity:   . Days of Exercise per Week:   . Minutes of Exercise per Session:   Stress:   . Feeling of Stress :   Social Connections:   . Frequency of Communication with Friends and Family:   . Frequency of Social Gatherings with Friends and Family:   . Attends Religious Services:   . Active Member of Clubs or Organizations:   . Attends Archivist Meetings:   Marland Kitchen Marital Status:   Intimate Partner Violence:   . Fear of Current or Ex-Partner:   . Emotionally Abused:   Marland Kitchen Physically Abused:   . Sexually Abused:     Past Surgical History:  Procedure Laterality Date  . CEREBRAL ANEURYSM REPAIR  2006  . COLONOSCOPY  07/2009   Ardis Hughs - Hx polyp  . WISDOM TOOTH EXTRACTION      Family History  Problem Relation Age of Onset  . Colon cancer Neg Hx   . Colon polyps Neg Hx   . Esophageal cancer Neg Hx   . Rectal cancer Neg Hx   . Stomach cancer Neg Hx   . Breast cancer Neg Hx     No Known Allergies  Current Outpatient Medications on File Prior to Visit  Medication Sig Dispense Refill  . aspirin 325 MG tablet Take 325 mg by mouth daily.    . calcium carbonate 200 MG capsule Take 250 mg by mouth 2 (two) times daily with a meal.    . clindamycin (CLEOCIN T) 1 % lotion Apply topically 2 (two) times daily. 60 mL 0  . conjugated estrogens (PREMARIN) vaginal cream 1 applicator 3 times weekly 42.5 g 12  . doxycycline (VIBRA-TABS) 100 MG tablet Take 1 tablet (100 mg total) by mouth 2 (two) times daily. 20 tablet 4  . lamoTRIgine (LAMICTAL) 100 MG tablet TAKE 1 TABLET IN THE MORNING AND 1 TABLET IN THE EVENING 180 tablet 0  . lisinopril (ZESTRIL) 10 MG tablet Take 1 tablet (10 mg total) by mouth daily. **DUE FOR YEARLY PHYSICAL** 90 tablet 0  . mesalamine (APRISO) 0.375 g 24 hr capsule Take 4 daily 120 capsule 11  . Multiple Vitamins-Minerals (CENTRUM SILVER  PO) Take by mouth.     No current facility-administered medications on file prior to visit.    LMP 06/18/2010 (Exact Date)       Objective:   Physical Exam Vitals and nursing note reviewed.  Constitutional:      General: She is not in acute distress.    Appearance: Normal appearance. She is well-developed. She is not ill-appearing.  HENT:     Head: Normocephalic and atraumatic.     Right Ear: Tympanic membrane, ear canal and external ear normal.  There is no impacted cerumen.     Left Ear: Tympanic membrane, ear canal and external ear normal. There is no impacted cerumen.     Nose: Nose normal. No congestion or rhinorrhea.     Mouth/Throat:     Mouth: Mucous membranes are moist.     Pharynx: Oropharynx is clear. No oropharyngeal exudate or posterior oropharyngeal erythema.  Eyes:     General:        Right eye: No discharge.        Left eye: No discharge.     Extraocular Movements: Extraocular movements intact.     Conjunctiva/sclera: Conjunctivae normal.     Pupils: Pupils are equal, round, and reactive to light.  Neck:     Thyroid: No thyromegaly.     Vascular: No carotid bruit.     Trachea: No tracheal deviation.  Cardiovascular:     Rate and Rhythm: Normal rate and regular rhythm.     Pulses: Normal pulses.     Heart sounds: Normal heart sounds. No murmur. No friction rub. No gallop.   Pulmonary:     Effort: Pulmonary effort is normal. No respiratory distress.     Breath sounds: Normal breath sounds. No stridor. No wheezing, rhonchi or rales.  Chest:     Chest wall: No tenderness.  Abdominal:     General: Abdomen is flat. Bowel sounds are normal. There is no distension.     Palpations: Abdomen is soft. There is no mass.     Tenderness: There is no abdominal tenderness. There is no right CVA tenderness, left CVA tenderness, guarding or rebound.     Hernia: No hernia is present.  Musculoskeletal:        General: No swelling, tenderness, deformity or signs of injury.  Normal range of motion.     Cervical back: Normal range of motion and neck supple.     Right lower leg: No edema.     Left lower leg: No edema.  Lymphadenopathy:     Cervical: No cervical adenopathy.  Skin:    General: Skin is warm and dry.     Coloration: Skin is not jaundiced or pale.     Findings: No bruising, erythema, lesion or rash.     Comments: No abnormalities noted under right breast or on right leg.  She does have trauma from itching on her right lower shin.  Neurological:     General: No focal deficit present.     Mental Status: She is alert and oriented to person, place, and time.     Cranial Nerves: No cranial nerve deficit.     Sensory: No sensory deficit.     Motor: No weakness.     Coordination: Coordination normal.     Gait: Gait normal.     Deep Tendon Reflexes: Reflexes normal.  Psychiatric:        Mood and Affect: Mood normal.        Behavior: Behavior normal.        Thought Content: Thought content normal.        Judgment: Judgment normal.       Assessment & Plan:  1. Routine general medical examination at a health care facility - Follow up in one year  - CBC with Differential/Platelet - Comprehensive metabolic panel - Lipid panel - TSH  2. Essential hypertension -She needs to start taking her lisinopril on a daily basis.  Start monitoring blood pressure at home.  Return precautions reviewed - CBC with  Differential/Platelet - Comprehensive metabolic panel - Lipid panel - TSH - lisinopril (ZESTRIL) 10 MG tablet; Take 1 tablet (10 mg total) by mouth daily. **DUE FOR YEARLY PHYSICAL**  Dispense: 90 tablet; Refill: 3  3. Cerebral aneurysm -Educated on the importance of taking Lamictal as directed. - CBC with Differential/Platelet - Comprehensive metabolic panel - Lipid panel - TSH - Lamotrigine level - lamoTRIgine (LAMICTAL) 100 MG tablet; TAKE 1 TABLET IN THE MORNING AND 1 TABLET IN THE EVENING  Dispense: 180 tablet; Refill: 3  4. Ulcerative  colitis with complication, unspecified location ([place]) - Needs to follow up with GI   5. Rosacea -Continue with clindamycin oitment   6. Pruritus  - triamcinolone ointment (KENALOG) 0.5 %; Apply 1 application topically 2 (two) times daily.  Dispense: 30 g; Refill: 1   Dorothyann Peng, NP

## 2019-11-01 ENCOUNTER — Telehealth: Payer: Self-pay | Admitting: Nurse Practitioner

## 2019-11-01 LAB — LAMOTRIGINE LEVEL: Lamotrigine Lvl: <0.5 ug/mL — ABNORMAL LOW (ref 4.0–18.0)

## 2019-11-11 ENCOUNTER — Other Ambulatory Visit: Payer: Self-pay

## 2019-11-11 MED ORDER — MESALAMINE ER 0.375 G PO CP24: ORAL_CAPSULE | ORAL | 11 refills | Status: DC

## 2019-11-11 NOTE — Telephone Encounter (Signed)
That is fine. I don't know what her dosage is because I think she switched to Apriso after I saw her in clinic. Please refill exactly same as what her current dose is. Please get her an appt. Thanks

## 2019-11-21 IMAGING — MG DIGITAL SCREENING BILATERAL MAMMOGRAM WITH TOMO AND CAD
8 series · 9 of 24 positions shown · non-contrast
Comparison: Previous exam(s).

CLINICAL DATA: Screening.

EXAM:
DIGITAL SCREENING BILATERAL MAMMOGRAM WITH TOMO AND CAD

[L CC synth-2D]
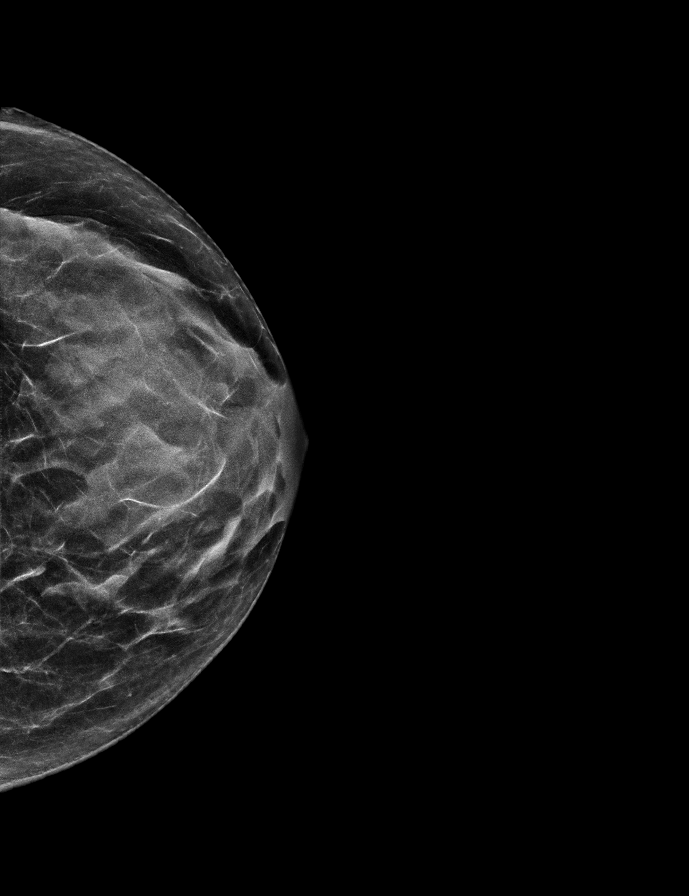

[R MLO synth-2D]
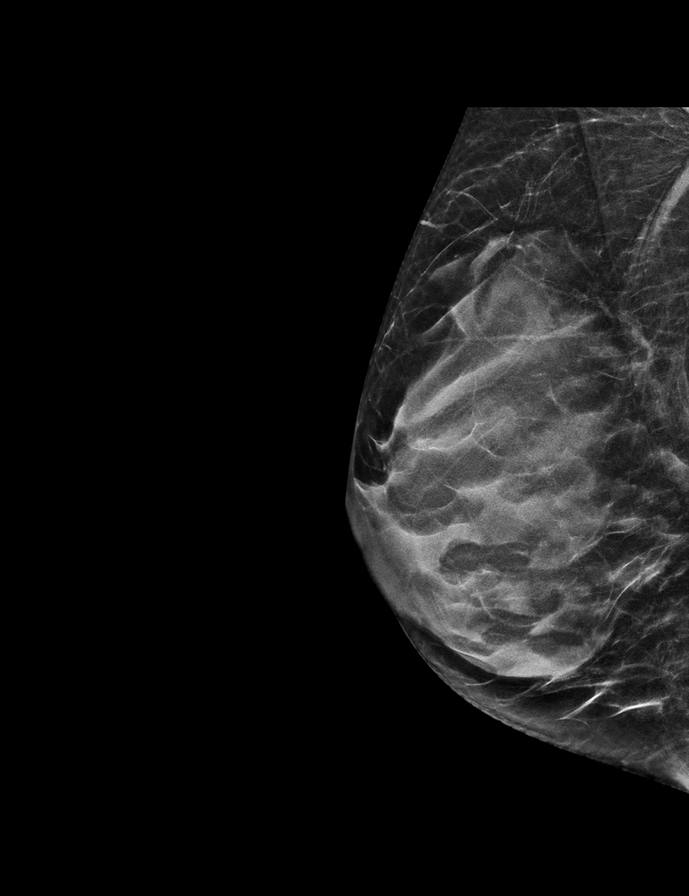

[R CC synth-2D]
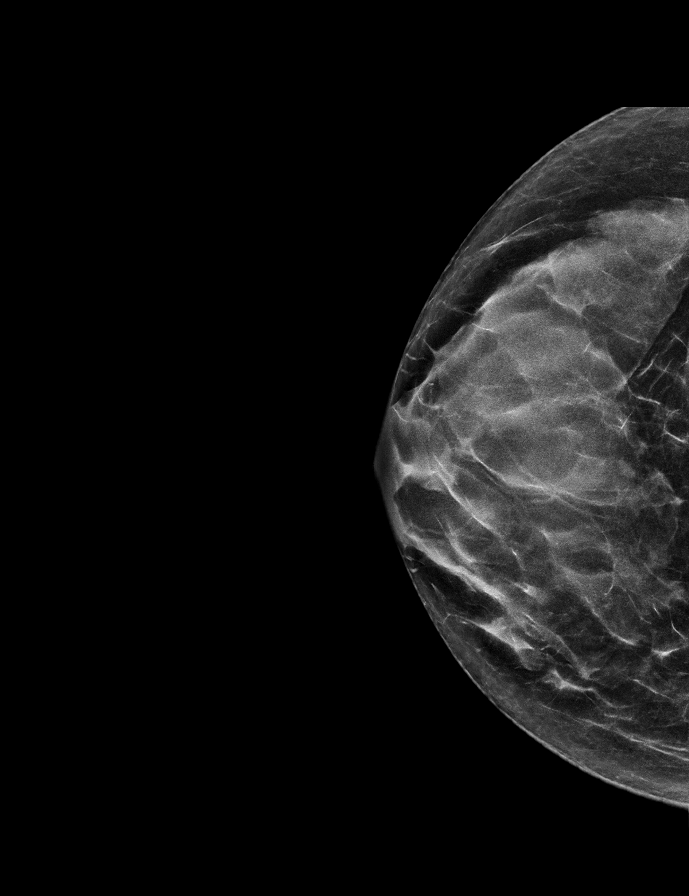

[L MLO synth-2D]
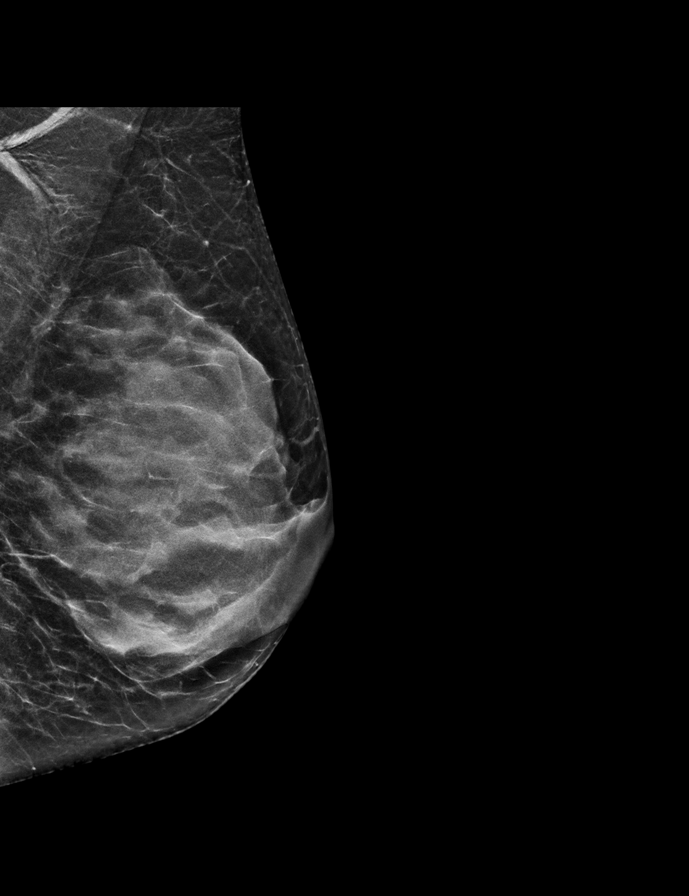

[L MLO tomo · 2 of 53 frames shown]
[frame 18/53]
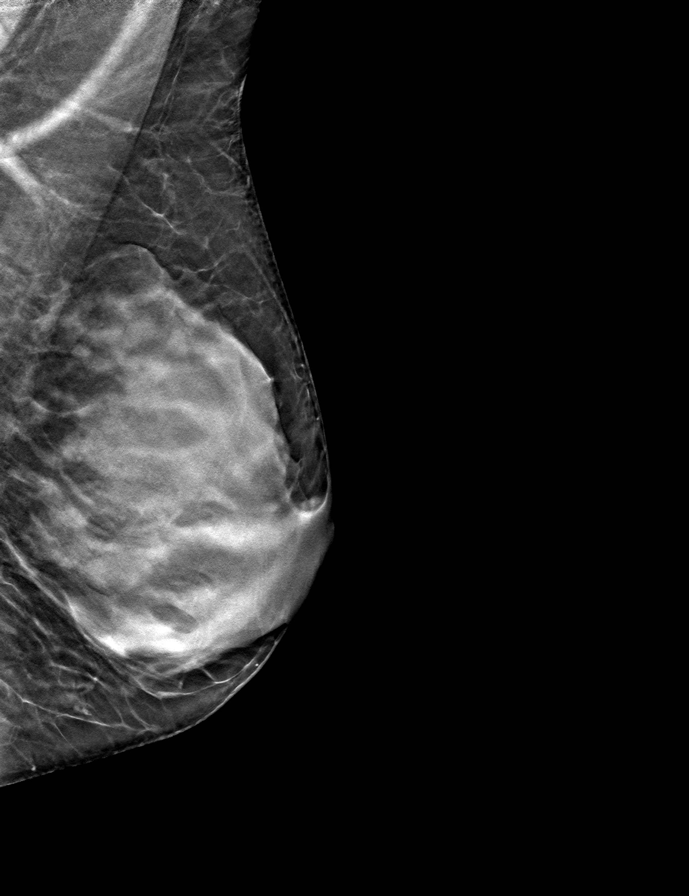
[frame 27/53]
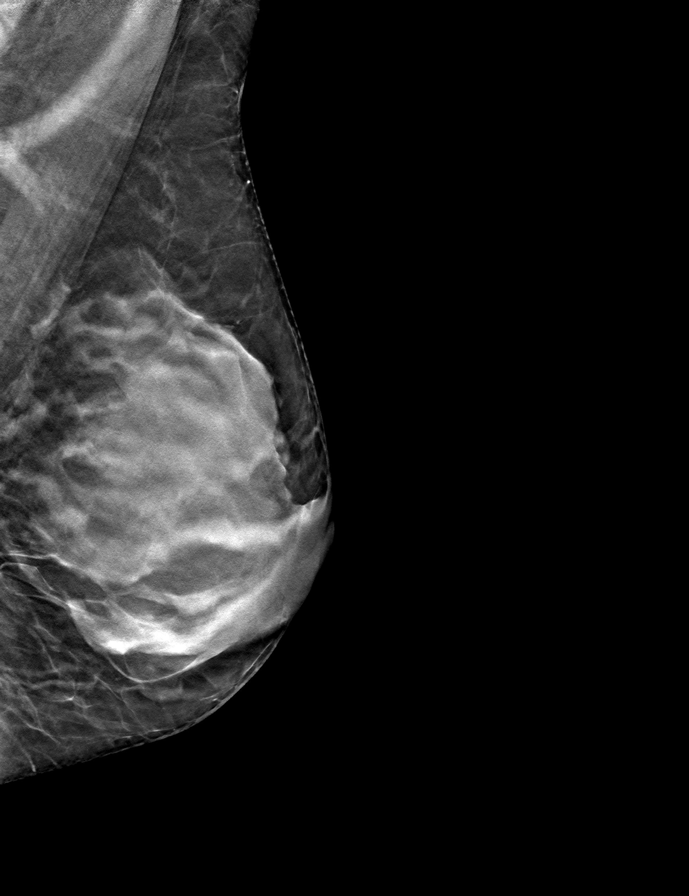

[L CC tomo · tomo slice 26/51.0]
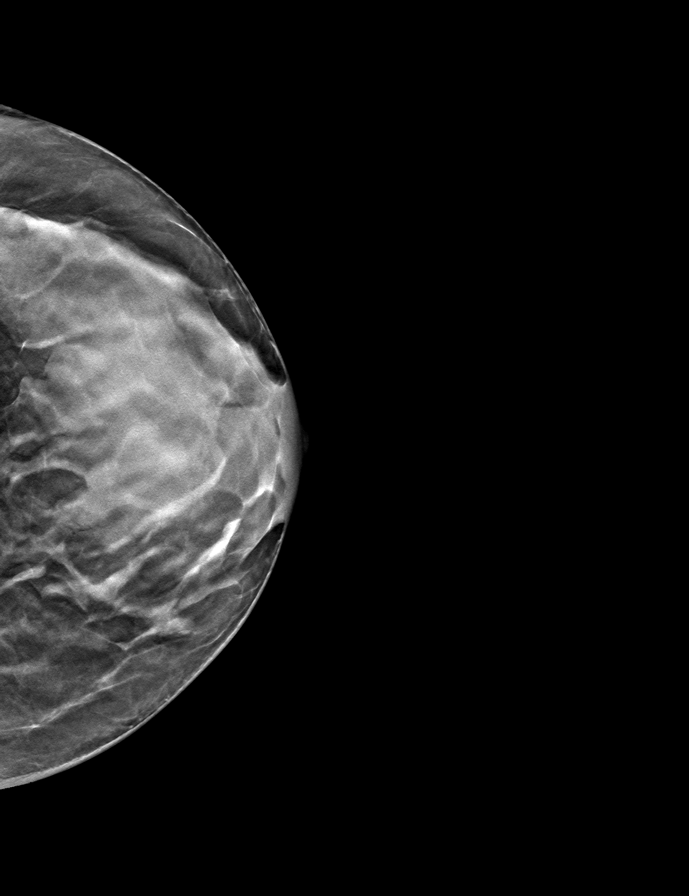

[R MLO tomo · tomo slice 26/51.0]
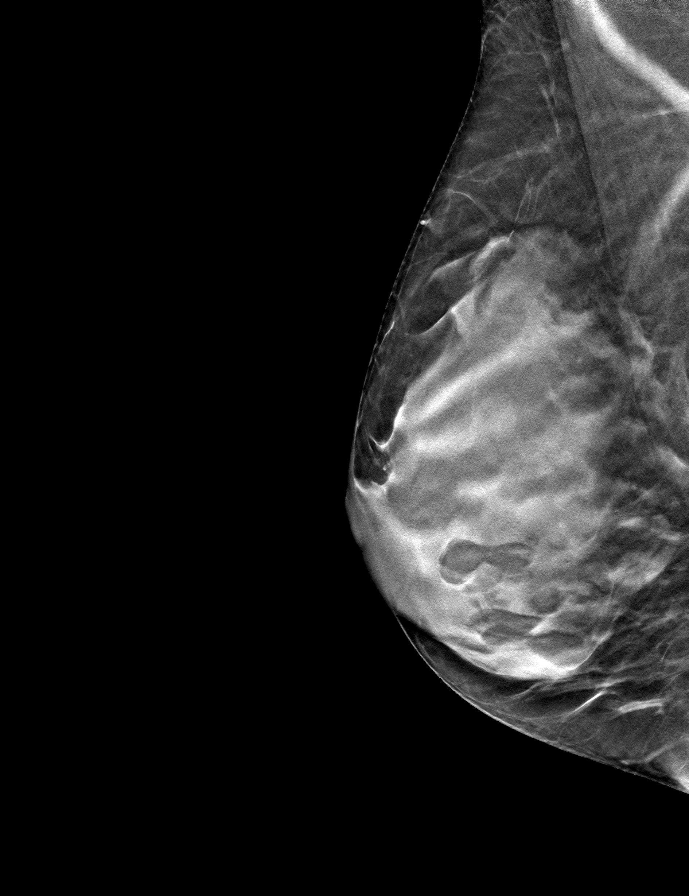

[R CC tomo · tomo slice 27/52.0]
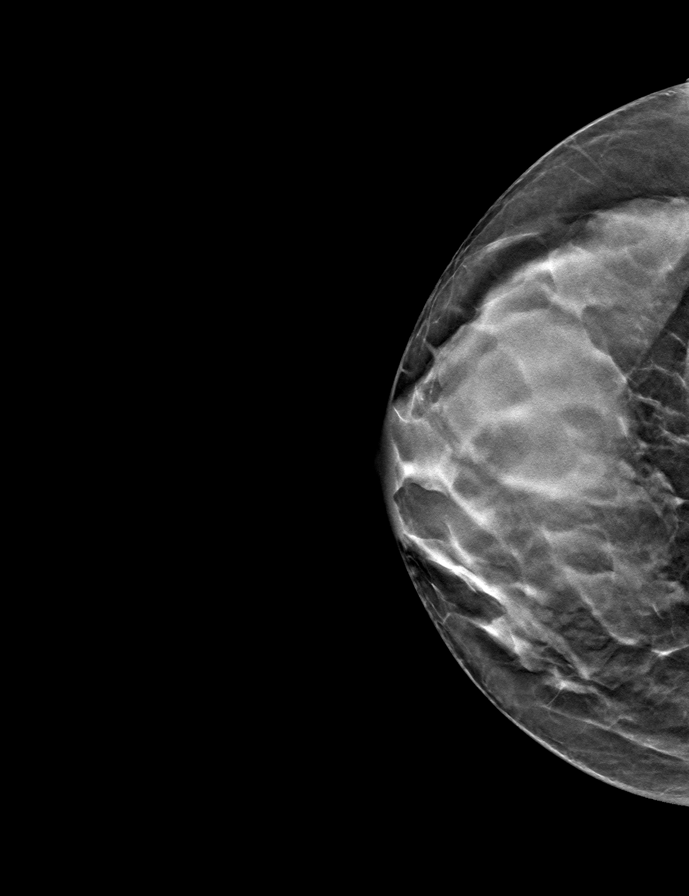

[9 of 24 positions shown; findings below may reference images not displayed]

ACR Breast Density Category c: The breast tissue is heterogeneously
dense, which may obscure small masses.
FINDINGS: There are no findings suspicious for malignancy. Images were
processed with CAD.
IMPRESSION: No mammographic evidence of malignancy. A result letter of this
screening mammogram will be mailed directly to the patient.

RECOMMENDATION:
Screening mammogram in one year. (Code:FT-U-LHB)

BI-RADS CATEGORY  1: Negative.

## 2019-11-25 ENCOUNTER — Telehealth: Payer: Self-pay | Admitting: Adult Health

## 2019-11-25 DIAGNOSIS — I10 Essential (primary) hypertension: Secondary | ICD-10-CM

## 2019-11-25 DIAGNOSIS — I671 Cerebral aneurysm, nonruptured: Secondary | ICD-10-CM

## 2019-11-25 DIAGNOSIS — L299 Pruritus, unspecified: Secondary | ICD-10-CM

## 2019-11-25 MED ORDER — LAMOTRIGINE 100 MG PO TABS: ORAL_TABLET | ORAL | 3 refills | Status: DC

## 2019-11-25 MED ORDER — TRIAMCINOLONE ACETONIDE 0.5 % EX OINT: 1.0000 "application " | TOPICAL_OINTMENT | Freq: Two times a day (BID) | CUTANEOUS | 1 refills | Status: DC

## 2019-11-25 MED ORDER — LISINOPRIL 10 MG PO TABS: 10.0000 mg | ORAL_TABLET | Freq: Every day | ORAL | 3 refills | Status: DC

## 2019-11-25 NOTE — Telephone Encounter (Signed)
SENT TO THE PHARMACY BY E-SCRIBE. 

## 2019-11-25 NOTE — Telephone Encounter (Signed)
Pt requesting  Refill on   triamcinolone ointment (KENALOG) 0.5 % lisinopril (ZESTRIL) 10 MG tablet lamoTRIgine (LAMICTAL) 100 MG tablet need rx sent to [facility] - [phone] refill line

## 2019-12-01 ENCOUNTER — Ambulatory Visit: Payer: 59 | Admitting: Nurse Practitioner

## 2019-12-13 ENCOUNTER — Ambulatory Visit: Payer: 59 | Admitting: Nurse Practitioner

## 2019-12-13 ENCOUNTER — Encounter: Payer: Self-pay | Admitting: Nurse Practitioner

## 2019-12-13 VITALS — BP 132/78 | HR 82 | Ht 63.0 in | Wt 120.0 lb

## 2019-12-13 DIAGNOSIS — R110 Nausea: Secondary | ICD-10-CM

## 2019-12-13 DIAGNOSIS — K51219 Ulcerative (chronic) proctitis with unspecified complications: Secondary | ICD-10-CM

## 2019-12-13 MED ORDER — MESALAMINE ER 0.375 G PO CP24: ORAL_CAPSULE | ORAL | 11 refills | Status: DC

## 2019-12-13 MED ORDER — ONDANSETRON HCL 4 MG PO TABS
4.0000 mg | ORAL_TABLET | Freq: Every day | ORAL | 0 refills | Status: DC
Start: 2019-12-13 — End: 2020-02-10

## 2019-12-13 NOTE — Progress Notes (Signed)
IMPRESSION and PLAN:    59 year old female with PMH significant for hypertension, left-sided ulcerative colitis.   # Ulcerative colitis, left-sided --Diagnosed 2017.  Doing well on Apriso 0.375 mg 4 tablets daily --Gets slightly nauseated sometimes after Apriso.  She inquires about alternative medications such as topicals.  We discussed Rowasa enemas but she would rather continue the pills over enemas. Furthermore, a change in therapy could disrupt clinical remission which may or may not come under control again with Mesalamine.   --Continue current dose of Apriso, 90-day supply given with 4 additional refills.  --Will give Zofran 4 mg to keep on hand for any significant nausea. --Follow-up in 1 year or sooner if needed --10 year follow up colonoscopy due February 2027   HPI:    Primary GI: Ruth Caprice, MD       Chief complaint : none. Needs refill on Apriso.   Ruth Stanley is a 59 year old female who was diagnosed with ulcerative colitis by Dr. Ardis Hughs in February 2017 after presenting with several months of bloody diarrhea.  She was started on mesalamine with improvement but only took it for a month.  Apparently no refills given at the time of initial prescription and she did not call in for one.  Patient was seen a few months later 11/14/2015 after having been off the mesalamine for a few months she was doing great.  Options discussed including resuming mesalamine versus waiting to see if she had a return of symptoms.  Patient preferred the latter approach.  Reviewing telephone records it appears that around November 2018 we started her on the Lialda, I assume for recurrent symptoms.  I saw her in December 2019 for follow-up.  She was doing well but having difficulty swallowing the large Lialda pills.  Lialda was not able to be broken or crushed so I changed her to Apriso 0.375 mg 4 tablets daily.  Patient has not been seen in the office since.  She called for refill Apriso in late April.   Medication was refilled, patient advised to come in for an appointment  HISTORY SINCE LAST VISIT: Ruth Stanley feels great.  Her bowel movements are normal.  No blood in stool.  No abdominal pain.  Weight is stable.  She is taking 4 Apriso daily.  For the most part medication well-tolerated.  She does have nausea sometimes after taking the medication. She inquires about alternative ( ? Topicals)   Previous Endoscopic Evaluations: Colonoscopy 08/28/15 ENDOSCOPIC IMPRESSION: There was moderate to severe inflammation in the left colon, this extended from the anus, confluently to about the splenic flexure where there was a fairly distinct transition to normal appearing mucosa proximally. The terminal ileum was normal. The right colon was randomly biopsied (appeared normal endoscopically). The left colon inflammation was randomly biopsied. There were no polyps. The examination was otherwise normal - Repeat colonoscopy in 10 years.   Diagnosis 1. Surgical [P], right colon biopsy - BENIGN COLONIC MUCOSA. - NO ACTIVE INFLAMMATION. - NO DYSPLASIA OR MALIGNANCY. 2. Surgical [P], left colon inflammation - ACTIVE COLITIS WITH MILD CHRONIC CHANGES, SEE COMMENT. - NO DYSPLASIA OR MALIGNANCY.  Review of systems:     No skin rashes, no joint aches, no urinary sx   Past Medical History:  Diagnosis Date  . Constipation   . Hypertension   . Seizures ([place])    last one 06/11/2012   . Ulcerative proctitis ([place])     Patient's surgical history, family medical history, social history, medications and  allergies were all reviewed in Epic   Creatinine clearance cannot be calculated (Patient's most recent lab result is older than the maximum 21 days allowed.)  Current Outpatient Medications  Medication Sig Dispense Refill  . aspirin 325 MG tablet Take 325 mg by mouth daily.    . clindamycin (CLEOCIN T) 1 % lotion Apply topically 2 (two) times daily. 60 mL 0  . lamoTRIgine (LAMICTAL) 100 MG tablet TAKE 1 TABLET  IN THE MORNING AND 1 TABLET IN THE EVENING 180 tablet 3  . lisinopril (ZESTRIL) 10 MG tablet Take 1 tablet (10 mg total) by mouth daily. 90 tablet 3  . mesalamine (APRISO) 0.375 g 24 hr capsule Take 4 daily 120 capsule 11  . Multiple Vitamins-Minerals (CENTRUM SILVER PO) Take by mouth.    . predniSONE (DELTASONE) 10 MG tablet Take one tablet prn for itching    . triamcinolone ointment (KENALOG) 0.5 % Apply 1 application topically 2 (two) times daily. 30 g 1   No current facility-administered medications for this visit.    Physical Exam:     BP 132/78   Pulse 82   Ht 5' 3"  (1.6 m)   Wt 120 lb (54.4 kg)   LMP 06/18/2010 (Exact Date)   BMI 21.26 kg/m   GENERAL:  Pleasant female in NAD PSYCH: : Cooperative, normal affect CARDIAC:  RRR PULM: Normal respiratory effort, lungs CTA bilaterally, no wheezing ABDOMEN:  Nondistended, soft, nontender. No obvious masses, no hepatomegaly,  normal bowel sounds SKIN:  turgor, no lesions seen Musculoskeletal:  Normal muscle tone, normal strength NEURO: Alert and oriented x 3, no focal neurologic deficits   Ruth Stanley , NP 12/13/2019, 4:05 PM

## 2019-12-13 NOTE — Patient Instructions (Signed)
If you are age 61 or older, your body mass index should be between 23-30. Your Body mass index is 21.26 kg/m. If this is out of the aforementioned range listed, please consider follow up with your Primary Care Provider.  If you are age 43 or younger, your body mass index should be between 19-25. Your Body mass index is 21.26 kg/m. If this is out of the aformentioned range listed, please consider follow up with your Primary Care Provider.   We have sent the following medications to your pharmacy: Mesalamine and Zofran  Due to recent changes in healthcare laws, you may see the results of your imaging and laboratory studies on MyChart before your provider has had a chance to review them.  We understand that in some cases there may be results that are confusing or concerning to you. Not all laboratory results come back in the same time frame and the provider may be waiting for multiple results in order to interpret others.  Please give [place] 48 hours in order for your provider to thoroughly review all the results before contacting the office for clarification of your results.

## 2020-02-07 ENCOUNTER — Telehealth: Payer: Self-pay | Admitting: Adult Health

## 2020-02-07 NOTE — Telephone Encounter (Signed)
Pt stated she only takes this when she itches and has took her last one from her last refill.   Medication Refill:  Prednisone   [facility] [place] to [facility]istered [place] Sites Phone:  [phone]  Fax:  [phone]

## 2020-02-08 MED ORDER — PREDNISONE 10 MG PO TABS: 10.0000 mg | ORAL_TABLET | Freq: Every day | ORAL | 1 refills | Status: DC | PRN

## 2020-02-08 NOTE — Addendum Note (Signed)
Addended by: Apolinar Junes on: 02/08/2020 10:57 AM   Modules accepted: Orders

## 2020-02-10 ENCOUNTER — Other Ambulatory Visit: Payer: Self-pay | Admitting: Nurse Practitioner

## 2020-02-16 ENCOUNTER — Telehealth: Payer: Self-pay | Admitting: Adult Health

## 2020-02-16 ENCOUNTER — Other Ambulatory Visit: Payer: Self-pay | Admitting: Adult Health

## 2020-02-16 MED ORDER — CLINDAMYCIN PHOSPHATE 1 % EX LOTN: TOPICAL_LOTION | Freq: Two times a day (BID) | CUTANEOUS | 0 refills | Status: DC

## 2020-02-16 NOTE — Telephone Encounter (Signed)
Pt lost medication for Clindamyacin and needs a refill called in.  She originally got this from Dr. Sherren Mocha.    Pharmacy- [facility]

## 2020-04-18 ENCOUNTER — Other Ambulatory Visit: Payer: Self-pay | Admitting: Adult Health

## 2020-05-19 ENCOUNTER — Other Ambulatory Visit: Payer: Self-pay

## 2020-05-19 ENCOUNTER — Encounter (HOSPITAL_COMMUNITY): Payer: Self-pay

## 2020-05-19 ENCOUNTER — Ambulatory Visit (HOSPITAL_COMMUNITY)
Admission: RE | Admit: 2020-05-19 | Discharge: 2020-05-19 | Disposition: A | Discharge status: Home / Self Care | Payer: 59 | Source: Ambulatory Visit | Attending: Family Medicine | Admitting: Family Medicine

## 2020-05-19 VITALS — BP 137/58 | HR 79 | Temp 98.2°F | Resp 15

## 2020-05-19 DIAGNOSIS — S46912A Strain of unspecified muscle, fascia and tendon at shoulder and upper arm level, left arm, initial encounter: Secondary | ICD-10-CM | POA: Diagnosis not present

## 2020-05-19 DIAGNOSIS — S39012A Strain of muscle, fascia and tendon of lower back, initial encounter: Secondary | ICD-10-CM

## 2020-05-19 MED ORDER — KETOROLAC TROMETHAMINE 30 MG/ML IJ SOLN
30.0000 mg | Freq: Once | INTRAMUSCULAR | Status: AC
  Administered 2020-05-19: 30 mg via INTRAMUSCULAR

## 2020-05-19 MED ORDER — CYCLOBENZAPRINE HCL 10 MG PO TABS
10.0000 mg | ORAL_TABLET | Freq: Two times a day (BID) | ORAL | 0 refills | Status: DC | PRN
Start: 2020-05-19 — End: 2021-03-02

## 2020-05-19 MED ORDER — KETOROLAC TROMETHAMINE 30 MG/ML IJ SOLN
INTRAMUSCULAR | Status: AC
  Filled 2020-05-19: qty 1

## 2020-05-19 NOTE — ED Provider Notes (Signed)
[place]    CSN: [id number] Arrival date & time: 05/19/20  1452      History   Chief Complaint Chief Complaint  Patient presents with  . Back Pain    HPI Ruth Stanley is a 59 y.o. female.   HPI Patient presents today with acute onset left shoulder pain and lumbar spine pain after playing trains with her grandson over the last 2 days.  She has been taking OTC anti-inflammatories without significant improvement.  She did apply heat yesterday and that seemed to ease the pain off significantly.  She denies any known injury such as a fall or lifting anything heavy.  She denies any history of recurrent back pain. Past Medical History:  Diagnosis Date  . Constipation   . Hypertension   . Seizures ([place])    last one 06/11/2012   . Ulcerative proctitis [facility])     Patient Active Problem List   Diagnosis Date Noted  . 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 Rosacea 05/11/2018  . Routine general medical examination at a health care facility 03/11/2017  . Cerebral aneurysm 01/27/2014  . RHINITIS 07/13/2009  . PERIMENOPAUSAL SYNDROME 02/22/2008  . History of seizure disorder 03/23/2007  . Essential hypertension 02/26/2007    Past Surgical History:  Procedure Laterality Date  . CEREBRAL ANEURYSM REPAIR  2006  . COLONOSCOPY  07/2009   Ardis Hughs - Hx polyp  . WISDOM TOOTH EXTRACTION      OB History   No obstetric history on file.      Home Medications    Prior to Admission medications   Medication Sig Start Date End Date Taking? Authorizing Provider  aspirin 325 MG tablet Take 325 mg by mouth daily.   Yes [provider]  clindamycin (CLEOCIN T) 1 % lotion APPLY TO AFFECTED AREA TWICE A DAY 04/18/20  Yes Nafziger, [name], NP  lamoTRIgine (LAMICTAL) 100 MG tablet TAKE 1 TABLET IN THE MORNING AND 1 TABLET IN THE EVENING 11/25/19  Yes Nafziger, [name], NP  lisinopril (ZESTRIL) 10 MG tablet Take 1 tablet (10 mg total) by mouth daily.  11/25/19  Yes Nafziger, [name], NP  mesalamine (APRISO) 0.375 g 24 hr capsule Take 4 daily 12/13/19  Yes Willia Craze, NP  Multiple Vitamins-Minerals (CENTRUM SILVER PO) Take by mouth.   Yes [provider]  ondansetron (ZOFRAN) 4 MG tablet TAKE 1 TABLET DAILY 02/10/20   Willia Craze, NP  predniSONE (DELTASONE) 10 MG tablet Take 1 tablet (10 mg total) by mouth daily as needed. Take one tablet prn for itching 02/08/20   Nafziger, [name], NP  triamcinolone ointment (KENALOG) 0.5 % Apply 1 application topically 2 (two) times daily. 11/25/19   Dorothyann Peng, NP    Family History Family History  Problem Relation Age of Onset  . Colon cancer Neg Hx   . Colon polyps Neg Hx   . Esophageal cancer Neg Hx   . Rectal cancer Neg Hx   . Stomach cancer Neg Hx   . Breast cancer Neg Hx     Social History Social History   Tobacco Use  . Smoking status: Former Smoker    Packs/day: 0.25    Types: Cigarettes    Quit date: 10/15/2004    Years since quitting: 15.6  . Smokeless tobacco: Never Used  Vaping Use  . Vaping Use: Never used  Substance Use Topics  . Alcohol use: Yes    Alcohol/week: 0.0 standard drinks    Comment: rare  wine  . Drug use: No     Allergies   Patient has no known allergies.   Review of Systems Review of Systems Pertinent negatives listed in HPI Physical Exam Triage Vital Signs ED Triage Vitals  Enc Vitals Group     BP 05/19/20 1534 (!) 137/58     Pulse Rate 05/19/20 1534 79     Resp 05/19/20 1534 15     Temp 05/19/20 1534 98.2 F (36.8 C)     Temp Source 05/19/20 1534 Oral     SpO2 05/19/20 1534 97 %     Weight --      Height --      Head Circumference --      Peak Flow --      Pain Score 05/19/20 1530 4     Pain Loc --      Pain Edu? --      Excl. in [place]? --    No data found.  Updated Vital Signs BP (!) 137/58 (BP Location: Right Arm)   Pulse 79   Temp 98.2 F (36.8 C) (Oral)   Resp 15   LMP 06/18/2010 (Exact Date)   SpO2 97%   Visual  Acuity Right Eye Distance:   Left Eye Distance:   Bilateral Distance:    Right Eye Near:   Left Eye Near:    Bilateral Near:     Physical Exam Constitutional:      Appearance: Normal appearance. She is not ill-appearing.  Eyes:     Extraocular Movements: Extraocular movements intact.     Pupils: Pupils are equal, round, and reactive to light.  Cardiovascular:     Rate and Rhythm: Normal rate and regular rhythm.  Pulmonary:     Effort: Pulmonary effort is normal.     Breath sounds: Normal breath sounds.  Musculoskeletal:     Left shoulder: No swelling, tenderness or bony tenderness. Normal range of motion. Normal strength.     Cervical back: Normal range of motion.       Back:  Lymphadenopathy:     Cervical: No cervical adenopathy.  Neurological:     General: No focal deficit present.     Mental Status: She is alert and oriented to person, place, and time.  Psychiatric:        Mood and Affect: Mood normal.        Behavior: Behavior normal.        Thought Content: Thought content normal.        Judgment: Judgment normal.      UC Treatments / Results  Labs (all labs ordered are listed, but only abnormal results are displayed) Labs Reviewed - No data to display  EKG   Radiology No results found.  Procedures Procedures (including critical care time)  Medications Ordered in UC Medications - No data to display  Initial Impression / Assessment and Plan / UC Course  I have reviewed the triage vital signs and the nursing notes.  Pertinent labs & imaging results that were available during my care of the patient were reviewed by me and considered in my medical decision making (see chart for details).     Acute left shoulder and acute lumbar spine pain, Toradol 30 mg IM given here in clinic today.  Prescribed cyclobenzaprine 5 mg twice daily as needed as needed for pain left shoulder lower back.  Continue heat applications.  Follow-up with primary care if symptoms  worsen or do not improve. Final Clinical Impressions(s) /  UC Diagnoses   Final diagnoses:  Strain of left shoulder, initial encounter  Strain of lumbar region, initial encounter     Discharge Instructions     You received a Toradol which is an anti-inflammatory injection medication today for shoulder and back pain. I have also prescribed you cyclobenzaprine 5 mg that she can take twice daily as needed if any residual shoulder or back pain. Continue heat application as needed. Follow-up with Primary care provider as needed.    ED Prescriptions    Medication Sig Dispense Auth. Provider   cyclobenzaprine (FLEXERIL) 10 MG tablet Take 1 tablet (10 mg total) by mouth 2 (two) times daily as needed for muscle spasms. 20 tablet Scot Jun, FNP     PDMP not reviewed this encounter.   Scot Jun, FNP 05/19/20 1818

## 2020-05-19 NOTE — Discharge Instructions (Addendum)
You received a Toradol which is an anti-inflammatory injection medication today for shoulder and back pain. I have also prescribed you cyclobenzaprine 5 mg that she can take twice daily as needed if any residual shoulder or back pain. Continue heat application as needed. Follow-up with Primary care provider as needed.

## 2020-05-19 NOTE — ED Triage Notes (Signed)
Patient c/o pain "under LFT shoulder blade and lower back" x 2 days ago.   Patient stated pain has progressively become worst.   Patient stated pain was relieved by pressure and heating pad at night.   Patient denies fall or trauma.   Patient denies history of shoulder and back pain.

## 2020-06-01 ENCOUNTER — Other Ambulatory Visit: Payer: Self-pay | Admitting: Adult Health

## 2020-06-01 DIAGNOSIS — L299 Pruritus, unspecified: Secondary | ICD-10-CM

## 2020-06-16 LAB — HM MAMMOGRAPHY

## 2020-07-04 ENCOUNTER — Encounter: Payer: Self-pay | Admitting: Adult Health

## 2020-08-10 ENCOUNTER — Other Ambulatory Visit: Payer: Self-pay | Admitting: Adult Health

## 2020-08-10 DIAGNOSIS — L299 Pruritus, unspecified: Secondary | ICD-10-CM

## 2020-10-12 ENCOUNTER — Other Ambulatory Visit: Payer: Self-pay | Admitting: Adult Health

## 2020-10-13 NOTE — Telephone Encounter (Signed)
Last office visit- 08/10/2020 Last refill-10/29/2019  No future visit scheduled

## 2020-11-06 ENCOUNTER — Other Ambulatory Visit: Payer: Self-pay | Admitting: Adult Health

## 2020-11-06 DIAGNOSIS — I10 Essential (primary) hypertension: Secondary | ICD-10-CM

## 2020-11-06 DIAGNOSIS — I671 Cerebral aneurysm, nonruptured: Secondary | ICD-10-CM

## 2020-11-21 ENCOUNTER — Telehealth: Payer: Self-pay | Admitting: Adult Health

## 2020-11-21 DIAGNOSIS — I671 Cerebral aneurysm, nonruptured: Secondary | ICD-10-CM

## 2020-11-21 MED ORDER — LAMOTRIGINE 100 MG PO TABS: ORAL_TABLET | ORAL | 0 refills | Status: DC

## 2020-11-21 NOTE — Telephone Encounter (Signed)
Ruth Stanley is calling and requesting a refill for lamoTRIgine (LAMICTAL) 100 MG tablet to be sent to    [facility], [place] to [address], [place] [address]  Phone:  [phone] Fax:  [phone]

## 2020-11-21 NOTE — Telephone Encounter (Signed)
Rx sent in

## 2020-12-20 ENCOUNTER — Other Ambulatory Visit: Payer: Self-pay | Admitting: Nurse Practitioner

## 2020-12-21 ENCOUNTER — Other Ambulatory Visit: Payer: Self-pay | Admitting: Adult Health

## 2021-01-07 ENCOUNTER — Other Ambulatory Visit: Payer: Self-pay | Admitting: Adult Health

## 2021-01-09 ENCOUNTER — Other Ambulatory Visit: Payer: Self-pay | Admitting: Adult Health

## 2021-01-29 ENCOUNTER — Other Ambulatory Visit: Payer: Self-pay | Admitting: Adult Health

## 2021-01-29 DIAGNOSIS — I671 Cerebral aneurysm, nonruptured: Secondary | ICD-10-CM

## 2021-01-29 DIAGNOSIS — I10 Essential (primary) hypertension: Secondary | ICD-10-CM

## 2021-02-06 ENCOUNTER — Other Ambulatory Visit: Payer: Self-pay | Admitting: Adult Health

## 2021-02-06 DIAGNOSIS — L299 Pruritus, unspecified: Secondary | ICD-10-CM

## 2021-02-12 ENCOUNTER — Telehealth: Payer: Self-pay | Admitting: Adult Health

## 2021-02-12 NOTE — Telephone Encounter (Signed)
Patient scheduled her physical on MyChart as an office visit.  She needs to r/s this appointment in a cpe time slot.

## 2021-03-02 ENCOUNTER — Encounter: Payer: Self-pay | Admitting: Adult Health

## 2021-03-02 ENCOUNTER — Other Ambulatory Visit: Payer: Self-pay

## 2021-03-02 ENCOUNTER — Ambulatory Visit: Payer: 59 | Admitting: Adult Health

## 2021-03-02 VITALS — BP 130/80 | HR 71 | Temp 98.7°F | Ht 64.0 in | Wt 118.0 lb

## 2021-03-02 DIAGNOSIS — I671 Cerebral aneurysm, nonruptured: Secondary | ICD-10-CM

## 2021-03-02 DIAGNOSIS — Z114 Encounter for screening for human immunodeficiency virus [HIV]: Secondary | ICD-10-CM

## 2021-03-02 DIAGNOSIS — I10 Essential (primary) hypertension: Secondary | ICD-10-CM | POA: Diagnosis not present

## 2021-03-02 DIAGNOSIS — Z23 Encounter for immunization: Secondary | ICD-10-CM

## 2021-03-02 DIAGNOSIS — Z0000 Encounter for general adult medical examination without abnormal findings: Secondary | ICD-10-CM

## 2021-03-02 DIAGNOSIS — K51919 Ulcerative colitis, unspecified with unspecified complications: Secondary | ICD-10-CM | POA: Diagnosis not present

## 2021-03-02 DIAGNOSIS — Z1159 Encounter for screening for other viral diseases: Secondary | ICD-10-CM

## 2021-03-02 LAB — CBC WITH DIFFERENTIAL/PLATELET
Basophils Absolute: 0.1 10*3/uL (ref 0.0–0.1)
Basophils Relative: 1 % (ref 0.0–3.0)
Eosinophils Absolute: 0.2 10*3/uL (ref 0.0–0.7)
Eosinophils Relative: 3.4 % (ref 0.0–5.0)
HCT: 40.7 % (ref 36.0–46.0)
Hemoglobin: 13.3 g/dL (ref 12.0–15.0)
Lymphocytes Relative: 18.3 % (ref 12.0–46.0)
Lymphs Abs: 0.9 10*3/uL (ref 0.7–4.0)
MCHC: 32.7 g/dL (ref 30.0–36.0)
MCV: 90.2 fl (ref 78.0–100.0)
Monocytes Absolute: 0.3 10*3/uL (ref 0.1–1.0)
Monocytes Relative: 6.2 % (ref 3.0–12.0)
Neutro Abs: 3.5 10*3/uL (ref 1.4–7.7)
Neutrophils Relative %: 71.1 % (ref 43.0–77.0)
Platelets: 277 10*3/uL (ref 150.0–400.0)
RBC: 4.52 Mil/uL (ref 3.87–5.11)
RDW: 14.1 % (ref 11.5–15.5)
WBC: 4.9 10*3/uL (ref 4.0–10.5)

## 2021-03-02 LAB — COMPREHENSIVE METABOLIC PANEL
ALT: 13 U/L (ref 0–35)
AST: 18 U/L (ref 0–37)
Albumin: 4.2 g/dL (ref 3.5–5.2)
Alkaline Phosphatase: 61 U/L (ref 39–117)
BUN: 19 mg/dL (ref 6–23)
CO2: 27 mEq/L (ref 19–32)
Calcium: 9.6 mg/dL (ref 8.4–10.5)
Chloride: 103 mEq/L (ref 96–112)
Creatinine, Ser: 0.9 mg/dL (ref 0.40–1.20)
GFR: 68.45 mL/min (ref >60.00)
Glucose, Bld: 93 mg/dL (ref 70–99)
Potassium: 4.6 mEq/L (ref 3.5–5.1)
Sodium: 139 mEq/L (ref 135–145)
Total Bilirubin: 0.5 mg/dL (ref 0.2–1.2)
Total Protein: 6.8 g/dL (ref 6.0–8.3)

## 2021-03-02 LAB — TSH: TSH: 0.9 u[IU]/mL (ref 0.35–5.50)

## 2021-03-02 LAB — LIPID PANEL
Cholesterol: 204 mg/dL — ABNORMAL HIGH (ref 0–200)
HDL: 62.9 mg/dL (ref >39.00)
LDL Cholesterol: 127 mg/dL — ABNORMAL HIGH (ref 0–99)
NonHDL: 141.42
Total CHOL/HDL Ratio: 3
Triglycerides: 73 mg/dL (ref 0.0–149.0)
VLDL: 14.6 mg/dL (ref 0.0–40.0)

## 2021-03-02 MED ORDER — LAMOTRIGINE 100 MG PO TABS: ORAL_TABLET | ORAL | 3 refills | Status: DC

## 2021-03-02 MED ORDER — LISINOPRIL 10 MG PO TABS: 10.0000 mg | ORAL_TABLET | Freq: Every day | ORAL | 3 refills | Status: DC

## 2021-03-02 NOTE — Progress Notes (Signed)
Subjective:    Patient ID: Ruth Stanley, female    DOB: 06/04/1961, 60 y.o.   MRN: [id number]  HPI Patient presents for yearly preventative medicine examination. She is a pleasant 60 year old female who  has a past medical history of Constipation, Hypertension, Seizures ([place]), and Ulcerative proctitis ([place]).  Essential Hypertension -prescribed lisinopril 10 mg daily.  She denies dizziness, lightheadedness, blurred vision, chest pain, shortness of breath BP Readings from Last 3 Encounters:  03/02/21 (!) 160/100  05/19/20 (!) 137/58  12/13/19 132/78   Ulcerative colitis-diagnosed in February 2017 and is managed by GI. Was last seen in June 2021. Does well with Apriso 0.375 mg 4 tablets daily  H/o CNS aneurysm repair-in 2006.  She has been on Lamictal 100 mg twice daily since that time. Last MRA in 2017. It is time for a repeat imaging.   All immunizations and health maintenance protocols were reviewed with the patient and needed orders were placed.  Appropriate screening laboratory values were ordered for the patient including screening of hyperlipidemia, renal function and hepatic function. If indicated by BPH, a PSA was ordered.  Medication reconciliation,  past medical history, social history, problem list and allergies were reviewed in detail with the patient  Goals were established with regard to weight loss, exercise, and  diet in compliance with medications  She is up to date on routine colon cancer screening. Needs a new GYN   She has no acute complaints.    Review of Systems  Constitutional: Negative.   HENT: Negative.    Eyes: Negative.   Respiratory: Negative.    Cardiovascular: Negative.   Gastrointestinal: Negative.   Endocrine: Negative.   Genitourinary: Negative.   Musculoskeletal: Negative.   Skin: Negative.   Allergic/Immunologic: Negative.   Neurological: Negative.   Hematological: Negative.   Psychiatric/Behavioral: Negative.     Past Medical  History:  Diagnosis Date   Constipation    Hypertension    Seizures ([place])    last one 06/11/2012    Ulcerative proctitis ([place])     Social History   Socioeconomic History   Marital status: Married    Spouse name: Not on file   Number of children: 1   Years of education: Not on file   Highest education level: Not on file  Occupational History   Occupation: reitired  Tobacco Use   Smoking status: Former    Packs/day: 0.25    Types: Cigarettes    Quit date: 10/15/2004    Years since quitting: 16.3   Smokeless tobacco: Never  Vaping Use   Vaping Use: Never used  Substance and Sexual Activity   Alcohol use: Yes    Alcohol/week: 0.0 standard drinks    Comment: rare wine   Drug use: No   Sexual activity: Yes    Birth control/protection: Post-menopausal  Other Topics Concern   Not on file  Social History Narrative   Not on file   Social Determinants of Health   Financial Resource Strain: Not on file  Food Insecurity: Not on file  Transportation Needs: Not on file  Physical Activity: Not on file  Stress: Not on file  Social Connections: Not on file  Intimate Partner Violence: Not on file    Past Surgical History:  Procedure Laterality Date   CEREBRAL ANEURYSM REPAIR  2006   COLONOSCOPY  07/2009   Ardis Hughs - Hx polyp   WISDOM TOOTH EXTRACTION      Family History  Problem Relation Age of  Onset   Colon cancer Neg Hx    Colon polyps Neg Hx    Esophageal cancer Neg Hx    Rectal cancer Neg Hx    Stomach cancer Neg Hx    Breast cancer Neg Hx     No Known Allergies  Current Outpatient Medications on File Prior to Visit  Medication Sig Dispense Refill   aspirin 325 MG tablet Take 325 mg by mouth daily.     clindamycin (CLEOCIN T) 1 % lotion APPLY TO AFFECTED AREA TWICE A DAY 60 mL 3   lamoTRIgine (LAMICTAL) 100 MG tablet TAKE 1 TABLET IN THE MORNING AND 1 TABLET IN THE EVENING 180 tablet 0   lisinopril (ZESTRIL) 10 MG tablet Take 1 tablet (10 mg total) by mouth  daily. *appointment needed for future refills 90 tablet 0   mesalamine (APRISO) 0.375 g 24 hr capsule TAKE 4 CAPSULES DAILY 120 capsule 3   Multiple Vitamins-Minerals (CENTRUM SILVER PO) Take by mouth.     ondansetron (ZOFRAN) 4 MG tablet TAKE 1 TABLET DAILY (Patient not taking: Reported on 03/02/2021) 20 tablet 0   predniSONE (DELTASONE) 10 MG tablet TAKE 1 TABLET DAILY AS     NEEDED FOR ITCHING (Patient not taking: Reported on 03/02/2021) 30 tablet 1   No current facility-administered medications on file prior to visit.    BP (!) 160/100   Pulse 71   Temp 98.7 F (37.1 C) (Oral)   Ht 5' 4"  (1.626 m)   Wt 118 lb (53.5 kg)   LMP 06/18/2010 (Exact Date)   SpO2 99%   BMI 20.25 kg/m        Objective:   Physical Exam Vitals and nursing note reviewed.  Constitutional:      General: She is not in acute distress.    Appearance: Normal appearance. She is well-developed. She is not ill-appearing.  HENT:     Head: Normocephalic and atraumatic.     Right Ear: Tympanic membrane, ear canal and external ear normal. There is no impacted cerumen.     Left Ear: Tympanic membrane, ear canal and external ear normal. There is no impacted cerumen.     Nose: Nose normal. No congestion or rhinorrhea.     Mouth/Throat:     Mouth: Mucous membranes are moist.     Pharynx: Oropharynx is clear. No oropharyngeal exudate or posterior oropharyngeal erythema.  Eyes:     General:        Right eye: No discharge.        Left eye: No discharge.     Extraocular Movements: Extraocular movements intact.     Conjunctiva/sclera: Conjunctivae normal.     Pupils: Pupils are equal, round, and reactive to light.  Neck:     Thyroid: No thyromegaly.     Vascular: No carotid bruit.     Trachea: No tracheal deviation.  Cardiovascular:     Rate and Rhythm: Normal rate and regular rhythm.     Pulses: Normal pulses.     Heart sounds: Normal heart sounds. No murmur heard.   No friction rub. No gallop.  Pulmonary:      Effort: Pulmonary effort is normal. No respiratory distress.     Breath sounds: Normal breath sounds. No stridor. No wheezing, rhonchi or rales.  Chest:     Chest wall: No tenderness.  Abdominal:     General: Abdomen is flat. Bowel sounds are normal. There is no distension.     Palpations: Abdomen is soft. There is  no mass.     Tenderness: There is no abdominal tenderness. There is no right CVA tenderness, left CVA tenderness, guarding or rebound.     Hernia: No hernia is present.  Musculoskeletal:        General: No swelling, tenderness, deformity or signs of injury. Normal range of motion.     Cervical back: Normal range of motion and neck supple.     Right lower leg: No edema.     Left lower leg: No edema.  Lymphadenopathy:     Cervical: No cervical adenopathy.  Skin:    General: Skin is warm and dry.     Coloration: Skin is not jaundiced or pale.     Findings: No bruising, erythema, lesion or rash.  Neurological:     General: No focal deficit present.     Mental Status: She is alert and oriented to person, place, and time.     Cranial Nerves: No cranial nerve deficit.     Sensory: No sensory deficit.     Motor: No weakness.     Coordination: Coordination normal.     Gait: Gait normal.     Deep Tendon Reflexes: Reflexes normal.  Psychiatric:        Mood and Affect: Mood normal.        Behavior: Behavior normal.        Thought Content: Thought content normal.        Judgment: Judgment normal.      Assessment & Plan:  1. Routine general medical examination at a health care facility - Benign exam  - Continue to stay active and eat healthy  - Shingles vaccination given today  - CBC with Differential/Platelet; Future - Comprehensive metabolic panel; Future - Lipid panel; Future - TSH; Future - Ambulatory referral to Gynecology  2. Ulcerative colitis with complication, unspecified location ([place]) - Follow up with GI as directed - CBC with Differential/Platelet; Future -  Comprehensive metabolic panel; Future - Lipid panel; Future - TSH; Future  3. Essential hypertension - Controlled.  - No change in medications  - CBC with Differential/Platelet; Future - Comprehensive metabolic panel; Future - Lipid panel; Future - TSH; Future - lisinopril (ZESTRIL) 10 MG tablet; Take 1 tablet (10 mg total) by mouth daily. *appointment needed for future refills  Dispense: 90 tablet; Refill: 3  4. Cerebral aneurysm  - CBC with Differential/Platelet; Future - Comprehensive metabolic panel; Future - Lipid panel; Future - TSH; Future - lamoTRIgine (LAMICTAL) 100 MG tablet; TAKE 1 TABLET IN THE MORNING AND 1 TABLET IN THE EVENING  Dispense: 180 tablet; Refill: 3 - MR Angiogram Head Wo Contrast; Future  5. Need for hepatitis C screening test  - Hep C Antibody; Future  6. Encounter for screening for HIV  - HIV Antibody (routine testing w rflx); Future  Dorothyann Peng, NP

## 2021-03-02 NOTE — Addendum Note (Signed)
Addended by: Gwenyth Ober R on: 03/02/2021 08:50 AM   Modules accepted: Orders

## 2021-03-02 NOTE — Addendum Note (Signed)
Addended by: Amanda Cockayne on: 03/02/2021 08:54 AM   Modules accepted: Orders

## 2021-03-02 NOTE — Patient Instructions (Signed)
It was great seeing you today   We will follow up with you regarding your blood work   I have placed an order for the MRA of the head - they will call you to schedule

## 2021-03-05 LAB — HEPATITIS C ANTIBODY
Hepatitis C Ab: NONREACTIVE
SIGNAL TO CUT-OFF: 0 (ref <1.00)

## 2021-03-05 LAB — HIV ANTIBODY (ROUTINE TESTING W REFLEX): HIV 1&2 Ab, 4th Generation: NONREACTIVE

## 2021-03-23 ENCOUNTER — Other Ambulatory Visit: Payer: 59

## 2021-03-30 ENCOUNTER — Ambulatory Visit
Admission: RE | Admit: 2021-03-30 | Discharge: 2021-03-30 | Disposition: A | Discharge status: Home / Self Care | Payer: 59 | Source: Ambulatory Visit | Attending: Adult Health | Admitting: Adult Health

## 2021-03-30 ENCOUNTER — Other Ambulatory Visit: Payer: Self-pay

## 2021-03-30 DIAGNOSIS — I671 Cerebral aneurysm, nonruptured: Secondary | ICD-10-CM

## 2021-04-04 ENCOUNTER — Other Ambulatory Visit: Payer: Self-pay | Admitting: Nurse Practitioner

## 2021-05-21 ENCOUNTER — Other Ambulatory Visit: Payer: Self-pay

## 2021-05-21 ENCOUNTER — Telehealth: Payer: Self-pay | Admitting: Gastroenterology

## 2021-05-21 ENCOUNTER — Other Ambulatory Visit: Payer: Self-pay | Admitting: Adult Health

## 2021-05-21 ENCOUNTER — Other Ambulatory Visit: Payer: Self-pay | Admitting: Nurse Practitioner

## 2021-05-21 MED ORDER — MESALAMINE ER 0.375 G PO CP24: ORAL_CAPSULE | ORAL | 1 refills | Status: DC

## 2021-05-21 NOTE — Telephone Encounter (Signed)
Patient called requesting a refill of mesalamine.  She usually gets it through [facility], but she only has enough for 2 days and [facility] [place].  If you are able to call in a prescription for her, could you please call her and let her know?  Thank you.

## 2021-05-21 NOTE — Telephone Encounter (Signed)
Patient of Dr Ardis Hughs last seen 12/2019 for follow up of her ulcerative proctitis. She is without symptoms or concerns at this time. Refill of medication given to local pharmacy. Patient agrees to an appointment with Tye Savoy, NP for follow up 06/15/21 at 8:30 am.

## 2021-06-04 ENCOUNTER — Encounter: Payer: Self-pay | Admitting: Adult Health

## 2021-06-15 ENCOUNTER — Ambulatory Visit: Payer: 59 | Admitting: Nurse Practitioner

## 2021-06-15 ENCOUNTER — Encounter: Payer: Self-pay | Admitting: Nurse Practitioner

## 2021-06-15 VITALS — BP 160/80 | HR 76 | Ht 64.0 in | Wt 122.6 lb

## 2021-06-15 DIAGNOSIS — K51919 Ulcerative colitis, unspecified with unspecified complications: Secondary | ICD-10-CM

## 2021-06-15 MED ORDER — MESALAMINE ER 0.375 G PO CP24: 1500.0000 mg | ORAL_CAPSULE | Freq: Every day | ORAL | 3 refills | Status: DC

## 2021-06-15 NOTE — Patient Instructions (Signed)
We have sent the following medications to your pharmacy for you to pick up at your convenience: Apriso.   If you are age 62 or older, your body mass index should be between 23-30. Your Body mass index is 21.04 kg/m. If this is out of the aforementioned range listed, please consider follow up with your Primary Care Provider.  If you are age 23 or younger, your body mass index should be between 19-25. Your Body mass index is 21.04 kg/m. If this is out of the aformentioned range listed, please consider follow up with your Primary Care Provider.   ________________________________________________________  The [facility] providers would like to encourage you to use [facility] to communicate with providers for non-urgent requests or questions.  Due to long hold times on the telephone, sending your provider a message by [facility] may be a faster and more efficient way to get a response.  Please allow 48 business hours for a response.  Please remember that this is for non-urgent requests.  _______________________________________________________

## 2021-06-15 NOTE — Progress Notes (Signed)
ASSESSMENT AND PLAN    # 60 yo female with left-sided ulcerative colitis diagnosed 2017, in clinical remission on Apriso 1.5 grams daily.  --Continue Apriso,refills provided for one year --Says her husband is concerned that she is constipated because she does not have daily bowel movement.  We discussed types / definition of constipation.  She does not have to have a bowel movement every day but should have a normal BM at least three times a week. Stools should not be hard, she shouldn't have to strain. If at anytime she feels constipated she will take Miralax.   --Follow up in one year, sooner if needed --Follow up 10 yr colonoscopy due Feb 2027   HISTORY OF PRESENT ILLNESS    Chief Complaint : follow up on [place] is a 60 y.o. female known to Dr. Ardis Hughs with a past medical history of ulcerative colitis diagnosed in 2017.   Additional medical history as listed in [place] .   Patient was last seen  June 2017 and was in clinical remission on Apriso, 4 daily.  She is here for yearly follow-up. She feels fine but husband worried that she is constipated because she isn't having a daily BM. She has a BM about every other day, adequate volume . Stools are not hard and she doesn't strain. No abdominal pain. No blood in stool.   02/26/21 CBC normal CMP normal  PREVIOUS ENDOSCOPIC EVALUATIONS / PERTINENT STUDIES:   Previous Endoscopic Evaluations: Colonoscopy 08/28/15 ENDOSCOPIC IMPRESSION: There was moderate to severe inflammation in the left colon, this extended from the anus, confluently to about the splenic flexure where there was a fairly distinct transition to normal appearing mucosa proximally. The terminal ileum was normal. The right colon was randomly biopsied (appeared normal endoscopically). The left colon inflammation was randomly biopsied. There were no polyps. The examination was otherwise normal - Repeat colonoscopy in 10 years.    Diagnosis 1. Surgical  [P], right colon biopsy - BENIGN COLONIC MUCOSA. - NO ACTIVE INFLAMMATION. - NO DYSPLASIA OR MALIGNANCY. 2. Surgical [P], left colon inflammation - ACTIVE COLITIS WITH MILD CHRONIC CHANGES, SEE COMMENT. - NO DYSPLASIA OR MALIGNANCY.   Current Medications, Allergies, Past Medical History, Past Surgical History, Family History and Social History were reviewed in [employer] record.     Current Outpatient Medications  Medication Sig Dispense Refill   aspirin 325 MG tablet Take 325 mg by mouth daily.     lamoTRIgine (LAMICTAL) 100 MG tablet TAKE 1 TABLET IN THE MORNING AND 1 TABLET IN THE EVENING 180 tablet 3   lisinopril (ZESTRIL) 10 MG tablet Take 1 tablet (10 mg total) by mouth daily. *appointment needed for future refills 90 tablet 3   mesalamine (APRISO) 0.375 g 24 hr capsule TAKE 4 CAPSULES DAILY-please call for an appointment 120 capsule 1   Multiple Vitamins-Minerals (CENTRUM SILVER PO) Take by mouth.     ondansetron (ZOFRAN) 4 MG tablet TAKE 1 TABLET DAILY 20 tablet 0   predniSONE (DELTASONE) 10 MG tablet TAKE 1 TABLET DAILY AS     NEEDED FOR ITCHING 30 tablet 1   No current facility-administered medications for this visit.    Review of Systems: No chest pain. No shortness of breath. No urinary complaints.   PHYSICAL EXAM :    Wt Readings from Last 3 Encounters:  06/15/21 122 lb 9.6 oz (55.6 kg)  03/02/21 118 lb (53.5 kg)  12/13/19 120 lb (54.4 kg)  BP (!) 160/80   Pulse 76   Ht 5' 4"  (1.626 m)   Wt 122 lb 9.6 oz (55.6 kg)   LMP 06/18/2010 (Exact Date)   BMI 21.04 kg/m  Constitutional:  Generally well appearing female in no acute distress. Psychiatric: Pleasant. Normal mood and affect. Behavior is normal. EENT: Pupils normal.  Conjunctivae are normal. No scleral icterus. Neck supple.  Cardiovascular: Normal rate, regular rhythm. No edema Pulmonary/chest: Effort normal and breath sounds normal. No wheezing, rales or rhonchi. Abdominal:  Soft, nondistended, nontender. Bowel sounds active throughout. There are no masses palpable. No hepatomegaly. Neurological: Alert and oriented to person place and time. Skin: Skin is warm and dry. No rashes noted.  Tye Savoy, NP  06/15/2021, 8:35 AM

## 2021-06-16 NOTE — Progress Notes (Signed)
I agree with the above note, plan 

## 2021-06-18 ENCOUNTER — Other Ambulatory Visit: Payer: Self-pay | Admitting: Obstetrics and Gynecology

## 2021-06-18 DIAGNOSIS — Z1231 Encounter for screening mammogram for malignant neoplasm of breast: Secondary | ICD-10-CM

## 2021-07-20 ENCOUNTER — Ambulatory Visit: Payer: 59

## 2021-09-18 ENCOUNTER — Telehealth: Payer: Self-pay | Admitting: Nurse Practitioner

## 2021-09-18 NOTE — Telephone Encounter (Signed)
Inbound call from patient. Would like refill for [facility]amine sent to [facility] for thi srefill and future  ?

## 2021-09-19 MED ORDER — MESALAMINE ER 0.375 G PO CP24: 1500.0000 mg | ORAL_CAPSULE | Freq: Every day | ORAL | 3 refills | Status: DC

## 2021-09-19 NOTE — Telephone Encounter (Signed)
Script sent to pharmacy.

## 2021-09-19 NOTE — Telephone Encounter (Signed)
Patient following on up call from yesterday, would like a returned call once it is sent in. Please advise.  ?

## 2021-12-14 ENCOUNTER — Ambulatory Visit: Payer: 59 | Admitting: Adult Health

## 2021-12-14 ENCOUNTER — Encounter: Payer: Self-pay | Admitting: Adult Health

## 2021-12-14 VITALS — BP 132/80 | HR 85 | Temp 97.8°F | Ht 64.0 in | Wt 118.0 lb

## 2021-12-14 DIAGNOSIS — M65342 Trigger finger, left ring finger: Secondary | ICD-10-CM | POA: Diagnosis not present

## 2021-12-14 MED ORDER — METHYLPREDNISOLONE ACETATE 40 MG/ML IJ SUSP
40.0000 mg | Freq: Once | INTRAMUSCULAR | Status: AC
  Administered 2021-12-14: 40 mg via INTRALESIONAL

## 2021-12-14 MED ORDER — METHYLPREDNISOLONE ACETATE 80 MG/ML IJ SUSP: 80.0000 mg | Freq: Once | INTRAMUSCULAR | Status: DC

## 2021-12-14 NOTE — Patient Instructions (Signed)
Health Maintenance Due  Topic Date Due   PAP SMEAR-Modifier  06/19/2015   Zoster Vaccines- Shingrix (2 of 2) 04/27/2021      Row Labels 03/02/2021    8:00 AM 03/11/2017    8:33 AM  Depression screen PHQ 2/9   Section Header. No data exists in this row.    Decreased Interest   0 0  Down, Depressed, Hopeless   0 0  PHQ - 2 Score   0 0

## 2021-12-14 NOTE — Progress Notes (Signed)
Subjective:    Patient ID: Ruth Stanley, female    DOB: 1961/06/28, 61 y.o.   MRN: [id number]  HPI  61 year old female who  has a past medical history of Constipation, Hypertension, Seizures ([place]), and Ulcerative proctitis ([place]).  She presents to the office today for an acute issue of a snapping and sticking sensation of her left ring finger. Reports pain at times and swelling.   Review of Systems See HPI   Past Medical History:  Diagnosis Date   Constipation    Hypertension    Seizures ([place])    last one 06/11/2012    Ulcerative proctitis ([place])     Social History   Socioeconomic History   Marital status: Married    Spouse name: Not on file   Number of children: 1   Years of education: Not on file   Highest education level: Not on file  Occupational History   Occupation: reitired  Tobacco Use   Smoking status: Former    Packs/day: 0.25    Types: Cigarettes    Quit date: 10/15/2004    Years since quitting: 17.1   Smokeless tobacco: Never  Vaping Use   Vaping Use: Never used  Substance and Sexual Activity   Alcohol use: Yes    Alcohol/week: 0.0 standard drinks of alcohol    Comment: rare wine   Drug use: No   Sexual activity: Yes    Birth control/protection: Post-menopausal  Other Topics Concern   Not on file  Social History Narrative   Not on file   Social Determinants of Health   Financial Resource Strain: Not on file  Food Insecurity: Not on file  Transportation Needs: Not on file  Physical Activity: Not on file  Stress: Not on file  Social Connections: Not on file  Intimate Partner Violence: Not on file    Past Surgical History:  Procedure Laterality Date   CEREBRAL ANEURYSM REPAIR  2006   COLONOSCOPY  07/2009   Ardis Hughs - Hx polyp   WISDOM TOOTH EXTRACTION      Family History  Problem Relation Age of Onset   Colon cancer Neg Hx    Colon polyps Neg Hx    Esophageal cancer Neg Hx    Rectal cancer Neg Hx    Stomach cancer Neg Hx     Breast cancer Neg Hx     No Known Allergies  Current Outpatient Medications on File Prior to Visit  Medication Sig Dispense Refill   aspirin 325 MG tablet Take 325 mg by mouth daily.     lamoTRIgine (LAMICTAL) 100 MG tablet TAKE 1 TABLET IN THE MORNING AND 1 TABLET IN THE EVENING 180 tablet 3   lisinopril (ZESTRIL) 10 MG tablet Take 1 tablet (10 mg total) by mouth daily. *appointment needed for future refills 90 tablet 3   mesalamine (APRISO) 0.375 g 24 hr capsule Take 4 capsules (1.5 g total) by mouth daily. 360 capsule 3   Multiple Vitamins-Minerals (CENTRUM SILVER PO) Take by mouth.     predniSONE (DELTASONE) 10 MG tablet TAKE 1 TABLET DAILY AS     NEEDED FOR ITCHING 30 tablet 1   ondansetron (ZOFRAN) 4 MG tablet TAKE 1 TABLET DAILY (Patient not taking: Reported on 12/14/2021) 20 tablet 0   triamcinolone (KENALOG) 0.1 % paste SMARTSIG:TO TEETH 5 Times Daily     No current facility-administered medications on file prior to visit.    BP (!) 160/80   Pulse 85   Temp  97.8 F (36.6 C) (Oral)   Ht 5' 4"  (1.626 m)   Wt 118 lb (53.5 kg)   LMP 06/18/2010 (Exact Date)   SpO2 96%   BMI 20.25 kg/m       Objective:   Physical Exam Vitals and nursing note reviewed.  Constitutional:      Appearance: Normal appearance.  Musculoskeletal:     Comments: Clicking and locking on left ring finger. Enlarged, tender nodule felt over the MCP joint  Skin:    General: Skin is warm and dry.     Capillary Refill: Capillary refill takes less than 2 seconds.  Neurological:     General: No focal deficit present.     Mental Status: She is alert and oriented to person, place, and time.  Psychiatric:        Mood and Affect: Mood normal.        Behavior: Behavior normal.        Thought Content: Thought content normal.        Judgment: Judgment normal.       Assessment & Plan:  1. Trigger ring finger of left hand - Discussed treatment options with patient including steroid injection and NSAIDS.  She opted for steroid injection. Verbal consent obtained. Palmer area cleansed with alcohol and cold spray used for anesthesia  .Using a 27 gauge needle, injected 0.5 ml of Depo Medrol 88m  at the proximal phalanx . There were no signs of resistance during needle advancement. Patient tolerated procedure well with immediate relief of symptoms. Discussed aftercare instructions.   - methylPREDNISolone acetate (DEPO-MEDROL) injection 40 mg  CDorothyann Peng NP

## 2022-02-12 ENCOUNTER — Other Ambulatory Visit: Payer: Self-pay | Admitting: Adult Health

## 2022-02-12 DIAGNOSIS — I671 Cerebral aneurysm, nonruptured: Secondary | ICD-10-CM

## 2022-02-12 DIAGNOSIS — I10 Essential (primary) hypertension: Secondary | ICD-10-CM

## 2022-02-17 ENCOUNTER — Other Ambulatory Visit: Payer: Self-pay | Admitting: Adult Health

## 2022-02-17 DIAGNOSIS — L299 Pruritus, unspecified: Secondary | ICD-10-CM

## 2022-02-22 ENCOUNTER — Other Ambulatory Visit: Payer: Self-pay | Admitting: Adult Health

## 2022-06-14 NOTE — Telephone Encounter (Signed)
Chart updated

## 2022-06-25 ENCOUNTER — Encounter: Payer: Self-pay | Admitting: Adult Health

## 2022-06-25 ENCOUNTER — Ambulatory Visit: Payer: 59 | Admitting: Adult Health

## 2022-06-25 VITALS — BP 120/70 | HR 70 | Temp 97.7°F | Ht 64.0 in | Wt 117.0 lb

## 2022-06-25 DIAGNOSIS — M65342 Trigger finger, left ring finger: Secondary | ICD-10-CM | POA: Diagnosis not present

## 2022-06-25 MED ORDER — METHYLPREDNISOLONE ACETATE 40 MG/ML IJ SUSP
40.0000 mg | Freq: Once | INTRAMUSCULAR | Status: AC
  Administered 2022-06-25: 40 mg via INTRA_ARTICULAR

## 2022-06-25 NOTE — Progress Notes (Signed)
   Subjective:    Patient ID: Ruth Stanley, female    DOB: 10-30-1960, 61 y.o.   MRN: [id number]  HPI 61 year old female who  has a past medical history of Constipation, Hypertension, Seizures ([place]), and Ulcerative proctitis ([place]).  She presents to the office today for recurrent trigger finger of her left ring finger. She reports swelling and a sticking snapping sensation of the finger. She was last evaluated for this about 6 months ago and had a steroid injection performed. This worked well for her and she would like to do it again.    Review of Systems     Objective:   Physical Exam Vitals and nursing note reviewed.  Constitutional:      Appearance: Normal appearance.  Cardiovascular:     Rate and Rhythm: Normal rate and regular rhythm.     Pulses: Normal pulses.     Heart sounds: Normal heart sounds.  Musculoskeletal:        General: Normal range of motion.     Comments: Tender nodule felt over A1 pulley system. Locking of the finger noted.   Skin:    General: Skin is warm and dry.     Capillary Refill: Capillary refill takes less than 2 seconds.  Neurological:     General: No focal deficit present.     Mental Status: She is alert and oriented to person, place, and time.  Psychiatric:        Mood and Affect: Mood normal.        Behavior: Behavior normal.        Thought Content: Thought content normal.        Judgment: Judgment normal.        Assessment & Plan:  1. Trigger ring finger of left hand - We again discussed treatment options and she opted for repeat steroid injection. Verbal consent obtained. Palmer area cleansed with betadine and alcohol. Cold spray used for anesthesia. Using a 27 gauge needle, injected 0.5 ml of Depo Medrol 40 mg at the proximal phalanx. There were no signs of resistance during needle advancement. Patient tolerated procedure well with immediate relief in her symptoms.  - Discussed aftercare instructions.    - methylPREDNISolone  acetate (DEPO-MEDROL) injection 40 mg

## 2022-08-21 ENCOUNTER — Other Ambulatory Visit: Payer: Self-pay | Admitting: Adult Health

## 2022-08-22 NOTE — Telephone Encounter (Signed)
Left message to return phone call.

## 2022-08-25 ENCOUNTER — Encounter: Payer: Self-pay | Admitting: Adult Health

## 2022-08-27 MED ORDER — CLINDAMYCIN PHOSPHATE 1 % EX LOTN: TOPICAL_LOTION | CUTANEOUS | 3 refills | Status: DC

## 2022-09-29 ENCOUNTER — Other Ambulatory Visit: Payer: Self-pay | Admitting: Adult Health

## 2022-09-29 DIAGNOSIS — L299 Pruritus, unspecified: Secondary | ICD-10-CM

## 2022-11-14 ENCOUNTER — Other Ambulatory Visit: Payer: Self-pay | Admitting: Adult Health

## 2022-11-14 DIAGNOSIS — L299 Pruritus, unspecified: Secondary | ICD-10-CM

## 2022-11-28 ENCOUNTER — Other Ambulatory Visit: Payer: Self-pay | Admitting: Nurse Practitioner

## 2022-12-08 ENCOUNTER — Other Ambulatory Visit: Payer: Self-pay | Admitting: Adult Health

## 2022-12-08 DIAGNOSIS — L299 Pruritus, unspecified: Secondary | ICD-10-CM

## 2022-12-31 ENCOUNTER — Other Ambulatory Visit: Payer: Self-pay | Admitting: Adult Health

## 2022-12-31 DIAGNOSIS — L299 Pruritus, unspecified: Secondary | ICD-10-CM

## 2023-01-23 ENCOUNTER — Other Ambulatory Visit: Payer: Self-pay | Admitting: Adult Health

## 2023-01-23 DIAGNOSIS — L299 Pruritus, unspecified: Secondary | ICD-10-CM

## 2023-01-23 DIAGNOSIS — I10 Essential (primary) hypertension: Secondary | ICD-10-CM

## 2023-01-23 DIAGNOSIS — I671 Cerebral aneurysm, nonruptured: Secondary | ICD-10-CM

## 2023-02-02 ENCOUNTER — Other Ambulatory Visit: Payer: Self-pay | Admitting: Adult Health

## 2023-02-04 NOTE — Telephone Encounter (Signed)
Pt is due for CPE 

## 2023-02-15 ENCOUNTER — Other Ambulatory Visit: Payer: Self-pay | Admitting: Nurse Practitioner

## 2023-02-15 ENCOUNTER — Other Ambulatory Visit: Payer: Self-pay | Admitting: Adult Health

## 2023-02-15 DIAGNOSIS — L299 Pruritus, unspecified: Secondary | ICD-10-CM

## 2023-02-17 NOTE — Telephone Encounter (Signed)
Keep scheduled appointment

## 2023-02-20 ENCOUNTER — Encounter (INDEPENDENT_AMBULATORY_CARE_PROVIDER_SITE_OTHER): Payer: Self-pay

## 2023-03-20 ENCOUNTER — Encounter: Payer: 59 | Admitting: Adult Health

## 2023-03-25 ENCOUNTER — Ambulatory Visit: Payer: 59 | Admitting: Gastroenterology

## 2023-03-25 ENCOUNTER — Encounter: Payer: Self-pay | Admitting: Gastroenterology

## 2023-03-25 VITALS — BP 124/80 | HR 82 | Ht 64.0 in | Wt 112.0 lb

## 2023-03-25 DIAGNOSIS — K5150 Left sided colitis without complications: Secondary | ICD-10-CM

## 2023-03-25 NOTE — Progress Notes (Signed)
[facility] progress note:  History: Ruth Stanley 03/25/2023   Reason for consult/chief complaint: ulcerative proctitis  (Pt states she is doing well today)   Subjective  HPI: Summary of GI issues: Diagnosed by Dr. Christella Hartigan with left-sided ulcerative colitis on February 2017 colonoscopy.  Only treatment thus far has been mesalamine (Apriso), which has maintained remission.  Feeling well at office visit with APP December 2022.  Today, she reports feeling well overall. She has been stable since starting Apriso 1.5 g daily.   We also reviewed and discussed her diagnosis of ulcerative colitis and current medication regimen. At that time, she was having explosive diarrhea without any bleeding.  Patient denies diarrhea, constipation, nausea, blood in stool, black stool, vomiting, abdominal pain, bloating, unintentional weight loss, reflux, dysphagia.    ROS:  Review of Systems  Constitutional:  Negative for appetite change and fever.  HENT:  Negative for trouble swallowing.   Respiratory:  Negative for cough and shortness of breath.   Cardiovascular:  Negative for chest pain.  Gastrointestinal:  Negative for abdominal distention, abdominal pain, anal bleeding, blood in stool, constipation, diarrhea, nausea, rectal pain and vomiting.  Genitourinary:  Negative for dysuria.  Musculoskeletal:  Negative for back pain.  Skin:  Negative for rash.  Neurological:  Negative for weakness.  All other systems reviewed and are negative.    Past Medical History: Past Medical History:  Diagnosis Date   Constipation    Hypertension    Seizures ([facility])    last one 06/11/2012    Ulcerative proctitis [facility])      Past Surgical History: Past Surgical History:  Procedure Laterality Date   CEREBRAL ANEURYSM REPAIR  2006   COLONOSCOPY  07/2009   Christella Hartigan - Hx polyp   WISDOM TOOTH EXTRACTION       Family History: Family History  Problem Relation Age of Onset    Colon cancer Neg Hx    Colon polyps Neg Hx    Esophageal cancer Neg Hx    Rectal cancer Neg Hx    Stomach cancer Neg Hx    Breast cancer Neg Hx     Social History: Social History   Socioeconomic History   Marital status: Married    Spouse name: Not on file   Number of children: 1   Years of education: Not on file   Highest education level: Associate degree: academic program  Occupational History   Occupation: reitired  Tobacco Use   Smoking status: Former    Current packs/day: 0.00    Types: Cigarettes    Quit date: 10/15/2004    Years since quitting: 18.4   Smokeless tobacco: Never  Vaping Use   Vaping status: Never Used  Substance and Sexual Activity   Alcohol use: Yes    Alcohol/week: 0.0 standard drinks of alcohol    Comment: rare wine   Drug use: No   Sexual activity: Yes    Birth control/protection: Post-menopausal  Other Topics Concern   Not on file  Social History Narrative   Not on file   Social Determinants of Health   Financial Resource Strain: Low Risk  (06/24/2022)   Overall Financial Resource Strain (CARDIA)    Difficulty of Paying Living Expenses: Not hard at all  Food Insecurity: No Food Insecurity (06/24/2022)   Hunger Vital Sign    Worried About Running Out of Food in the Last Year: Never true    Ran Out of Food in the Last Year: Never true  Transportation Needs: No Transportation Needs (06/24/2022)   PRAPARE - Administrator, Civil Service (Medical): No    Lack of Transportation (Non-Medical): No  Physical Activity: Sufficiently Active (06/24/2022)   Exercise Vital Sign    Days of Exercise per Week: 5 days    Minutes of Exercise per Session: 30 min  Stress: Stress Concern Present (06/24/2022)   [employer] of Occupational Health - Occupational Stress Questionnaire    Feeling of Stress : To some extent  Social Connections: Socially Integrated (06/24/2022)   Social Connection and Isolation Panel [NHANES]    Frequency of  Communication with Friends and Family: Three times a week    Frequency of Social Gatherings with Friends and Family: Once a week    Attends Religious Services: 1 to 4 times per year    Active Member of [employer] or Organizations: Yes    Attends Banker Meetings: 1 to 4 times per year    Marital Status: Married    Allergies: No Known Allergies  Outpatient Meds: Current Outpatient Medications  Medication Sig Dispense Refill   aspirin 325 MG tablet Take 325 mg by mouth daily.     clindamycin (CLEOCIN T) 1 % lotion APPLY TO AFFECTED AREA TWICE A DAY 60 mL 3   lamoTRIgine (LAMICTAL) 100 MG tablet TAKE 1 TABLET IN THE       MORNING AND 1 TABLET IN THEEVENING 180 tablet 3   lisinopril (ZESTRIL) 10 MG tablet TAKE 1 TABLET DAILY 90 tablet 3   mesalamine (APRISO) 0.375 g 24 hr capsule TAKE 4 CAPSULES (1.5GRAMS  TOTAL) DAILY 360 capsule 0   Multiple Vitamins-Minerals (CENTRUM SILVER PO) Take by mouth.     predniSONE (DELTASONE) 10 MG tablet TAKE 1 TABLET DAILY AS     NEEDED FOR ITCHING 30 tablet 1   triamcinolone (KENALOG) 0.1 % paste SMARTSIG:TO TEETH 5 Times Daily     triamcinolone ointment (KENALOG) 0.5 % APPLY 1 APPLICATION        TOPICALLY TWO TIMES A DAY 30 g 0   No current facility-administered medications for this visit.      ___________________________________________________________________ Objective   Exam:  BP 124/80   Pulse 82   Ht 5\' 4"  (1.626 m)   Wt 112 lb (50.8 kg)   LMP 06/18/2010 (Exact Date)   BMI 19.22 kg/m  Wt Readings from Last 3 Encounters:  03/25/23 112 lb (50.8 kg)  06/25/22 117 lb (53.1 kg)  12/14/21 118 lb (53.5 kg)   General: well-appearing   Eyes: sclera anicteric, no redness ENT: oral mucosa moist without lesions, no cervical or supraclavicular lymphadenopathy CV: RRR, no JVD, no peripheral edema Resp: clear to auscultation bilaterally, normal RR and effort noted GI: soft, no tenderness, with active bowel sounds. No guarding or palpable  organomegaly noted. Skin; warm and dry, no rash or jaundice noted Neuro: awake, alert and oriented x 3. Normal gross motor function and fluent speech  Labs:  Radiologic Studies:  Assessment: Left sided ulcerative colitis without complication (HCC)  Stable for years since initial diagnosis.  Discussed natural history of IBD and common recurrence of symptoms if on no treatment.  I would at least like to de-escalate her therapy, and she is very glad to hear that.  Plan:  -Decrease Apriso to 3 capsules daily for a month then, if still feeling well, decrease to 2 capsules daily  -Advised to start a calcium supplement with vitamin D daily  Follow-up clinic in 1 year, sooner  if needed.  Unless diagnostic colonoscopy needed in the interim, next colonoscopy for screening purposes approximately February 2027.  We discussed the need for screening exams every 1 to 2 years starting at the 15-year mark from diagnosis with left-sided colitis.  Increased risk of colorectal cancer requires closer screening in those patients.  Lastly, have routine lab work done with primary care at annual visit that will include LFTs to screen for [facility] and creatinine to screen for the rare risk of mesalamine induced nephritis.  Thank you for the courtesy of this consult.  Please call me with any questions or concerns.   I,Safa M Kadhim,acting as a scribe for [name], MD.,have documented all relevant documentation on the behalf of Sherrilyn Rist, MD,as directed by  Sherrilyn Rist, MD while in the presence of Sherrilyn Rist, MD.   Marvis Repress III, MD, have reviewed all documentation for this visit. The documentation on 03/25/23 for the exam, diagnosis, procedures, and orders are all accurate and complete.    CC: Referring provider noted above

## 2023-03-25 NOTE — Patient Instructions (Signed)
_______________________________________________________  If your blood pressure at your visit was 140/90 or greater, please contact your primary care physician to follow up on this.  _______________________________________________________  If you are age 64 or older, your body mass index should be between 23-30. Your Body mass index is 19.22 kg/m. If this is out of the aforementioned range listed, please consider follow up with your Primary Care Provider.  If you are age 73 or younger, your body mass index should be between 19-25. Your Body mass index is 19.22 kg/m. If this is out of the aformentioned range listed, please consider follow up with your Primary Care Provider.   ________________________________________________________  The [facility] providers would like to encourage you to use [facility] to communicate with providers for non-urgent requests or questions.  Due to long hold times on the telephone, sending your provider a message by [facility] may be a faster and more efficient way to get a response.  Please allow 48 business hours for a response.  Please remember that this is for non-urgent requests.  _______________________________________________________  Follow up in one year or sooner if needed.   It was a pleasure to see you today!  Thank you for trusting me with your gastrointestinal care!

## 2023-03-25 NOTE — Progress Notes (Deleted)
[facility] progress note:  History: Ruth Stanley 03/25/2023   Reason for consult/chief complaint: No chief complaint on file.   Subjective  HPI: Summary of GI issues: Diagnosed by Dr. Christella Hartigan with left-sided ulcerative colitis on February 2017 colonoscopy.  Only treatment thus far has been mesalamine (Apriso), which has maintained remission.  Feeling well at office visit with APP December 2022.   ***   ROS:  Review of Systems   Past Medical History: Past Medical History:  Diagnosis Date   Constipation    Hypertension    Seizures (HCC)    last one 06/11/2012    Ulcerative proctitis [facility])      Past Surgical History: Past Surgical History:  Procedure Laterality Date   CEREBRAL ANEURYSM REPAIR  2006   COLONOSCOPY  07/2009   Christella Hartigan - Hx polyp   WISDOM TOOTH EXTRACTION       Family History: Family History  Problem Relation Age of Onset   Colon cancer Neg Hx    Colon polyps Neg Hx    Esophageal cancer Neg Hx    Rectal cancer Neg Hx    Stomach cancer Neg Hx    Breast cancer Neg Hx     Social History: Social History   Socioeconomic History   Marital status: Married    Spouse name: Not on file   Number of children: 1   Years of education: Not on file   Highest education level: Associate degree: academic program  Occupational History   Occupation: reitired  Tobacco Use   Smoking status: Former    Current packs/day: 0.00    Types: Cigarettes    Quit date: 10/15/2004    Years since quitting: 18.4   Smokeless tobacco: Never  Vaping Use   Vaping status: Never Used  Substance and Sexual Activity   Alcohol use: Yes    Alcohol/week: 0.0 standard drinks of alcohol    Comment: rare wine   Drug use: No   Sexual activity: Yes    Birth control/protection: Post-menopausal  Other Topics Concern   Not on file  Social History Narrative   Not on file   Social Determinants of Health   Financial Resource Strain: Low Risk   (06/24/2022)   Overall Financial Resource Strain (CARDIA)    Difficulty of Paying Living Expenses: Not hard at all  Food Insecurity: No Food Insecurity (06/24/2022)   Hunger Vital Sign    Worried About Running Out of Food in the Last Year: Never true    Ran Out of Food in the Last Year: Never true  Transportation Needs: No Transportation Needs (06/24/2022)   PRAPARE - Administrator, Civil Service (Medical): No    Lack of Transportation (Non-Medical): No  Physical Activity: Sufficiently Active (06/24/2022)   Exercise Vital Sign    Days of Exercise per Week: 5 days    Minutes of Exercise per Session: 30 min  Stress: Stress Concern Present (06/24/2022)   [employer] of Occupational Health - Occupational Stress Questionnaire    Feeling of Stress : To some extent  Social Connections: Socially Integrated (06/24/2022)   Social Connection and Isolation Panel [NHANES]    Frequency of Communication with Friends and Family: Three times a week    Frequency of Social Gatherings with Friends and Family: Once a week    Attends Religious Services: 1 to 4 times per year    Active Member of [employer] or Organizations: Yes    Attends Banker Meetings:  1 to 4 times per year    Marital Status: Married    Allergies: No Known Allergies  Outpatient Meds: Current Outpatient Medications  Medication Sig Dispense Refill   aspirin 325 MG tablet Take 325 mg by mouth daily.     clindamycin (CLEOCIN T) 1 % lotion APPLY TO AFFECTED AREA TWICE A DAY 60 mL 3   lamoTRIgine (LAMICTAL) 100 MG tablet TAKE 1 TABLET IN THE       MORNING AND 1 TABLET IN THEEVENING 180 tablet 3   lisinopril (ZESTRIL) 10 MG tablet TAKE 1 TABLET DAILY 90 tablet 3   mesalamine (APRISO) 0.375 g 24 hr capsule TAKE 4 CAPSULES (1.5GRAMS  TOTAL) DAILY 360 capsule 0   Multiple Vitamins-Minerals (CENTRUM SILVER PO) Take by mouth.     predniSONE (DELTASONE) 10 MG tablet TAKE 1 TABLET DAILY AS     NEEDED FOR ITCHING 30  tablet 1   triamcinolone (KENALOG) 0.1 % paste SMARTSIG:TO TEETH 5 Times Daily     triamcinolone ointment (KENALOG) 0.5 % APPLY 1 APPLICATION        TOPICALLY TWO TIMES A DAY 30 g 0   No current facility-administered medications for this visit.      ___________________________________________________________________ Objective   Exam:  LMP 06/18/2010 (Exact Date)  Wt Readings from Last 3 Encounters:  06/25/22 117 lb (53.1 kg)  12/14/21 118 lb (53.5 kg)  06/15/21 122 lb 9.6 oz (55.6 kg)    General: ***  Eyes: sclera anicteric, no redness ENT: oral mucosa moist without lesions, no cervical or supraclavicular lymphadenopathy CV: ***, no JVD, no peripheral edema Resp: clear to auscultation bilaterally, normal RR and effort noted GI: soft, *** tenderness, with active bowel sounds. No guarding or palpable organomegaly noted. Skin; warm and dry, no rash or jaundice noted Neuro: awake, alert and oriented x 3. Normal gross motor function and fluent speech  Labs:  ***  Radiologic Studies:  ***  Assessment: No diagnosis found.  ***  Plan:  ***  Thank you for the courtesy of this consult.  Please call me with any questions or concerns.  [name]  CC: Referring provider noted above

## 2023-03-28 ENCOUNTER — Encounter: Payer: 59 | Admitting: Adult Health

## 2023-04-03 ENCOUNTER — Ambulatory Visit: Payer: 59 | Admitting: Adult Health

## 2023-04-03 ENCOUNTER — Encounter: Payer: Self-pay | Admitting: Adult Health

## 2023-04-03 VITALS — BP 130/80 | HR 73 | Temp 98.0°F | Ht 63.0 in | Wt 113.0 lb

## 2023-04-03 DIAGNOSIS — F419 Anxiety disorder, unspecified: Secondary | ICD-10-CM | POA: Diagnosis not present

## 2023-04-03 DIAGNOSIS — K51819 Other ulcerative colitis with unspecified complications: Secondary | ICD-10-CM | POA: Diagnosis not present

## 2023-04-03 DIAGNOSIS — Z8669 Personal history of other diseases of the nervous system and sense organs: Secondary | ICD-10-CM

## 2023-04-03 DIAGNOSIS — Z0000 Encounter for general adult medical examination without abnormal findings: Secondary | ICD-10-CM | POA: Diagnosis not present

## 2023-04-03 DIAGNOSIS — I671 Cerebral aneurysm, nonruptured: Secondary | ICD-10-CM | POA: Diagnosis not present

## 2023-04-03 DIAGNOSIS — I10 Essential (primary) hypertension: Secondary | ICD-10-CM

## 2023-04-03 LAB — COMPREHENSIVE METABOLIC PANEL
ALT: 17 U/L (ref 0–35)
AST: 23 U/L (ref 0–37)
Albumin: 4.3 g/dL (ref 3.5–5.2)
Alkaline Phosphatase: 54 U/L (ref 39–117)
BUN: 14 mg/dL (ref 6–23)
CO2: 28 mEq/L (ref 19–32)
Calcium: 10.1 mg/dL (ref 8.4–10.5)
Chloride: 105 mEq/L (ref 96–112)
Creatinine, Ser: 0.79 mg/dL (ref 0.40–1.20)
GFR: 78.88 mL/min (ref >60.00)
Glucose, Bld: 94 mg/dL (ref 70–99)
Potassium: 4.4 mEq/L (ref 3.5–5.1)
Sodium: 141 mEq/L (ref 135–145)
Total Bilirubin: 0.7 mg/dL (ref 0.2–1.2)
Total Protein: 6.7 g/dL (ref 6.0–8.3)

## 2023-04-03 LAB — CBC WITH DIFFERENTIAL/PLATELET
Basophils Absolute: 0 10*3/uL (ref 0.0–0.1)
Basophils Relative: 1 % (ref 0.0–3.0)
Eosinophils Absolute: 0.2 10*3/uL (ref 0.0–0.7)
Eosinophils Relative: 4.7 % (ref 0.0–5.0)
HCT: 40.9 % (ref 36.0–46.0)
Hemoglobin: 13.4 g/dL (ref 12.0–15.0)
Lymphocytes Relative: 17.9 % (ref 12.0–46.0)
Lymphs Abs: 0.7 10*3/uL (ref 0.7–4.0)
MCHC: 32.7 g/dL (ref 30.0–36.0)
MCV: 93.4 fl (ref 78.0–100.0)
Monocytes Absolute: 0.3 10*3/uL (ref 0.1–1.0)
Monocytes Relative: 8.9 % (ref 3.0–12.0)
Neutro Abs: 2.6 10*3/uL (ref 1.4–7.7)
Neutrophils Relative %: 67.5 % (ref 43.0–77.0)
Platelets: 235 10*3/uL (ref 150.0–400.0)
RBC: 4.38 Mil/uL (ref 3.87–5.11)
RDW: 13.4 % (ref 11.5–15.5)
WBC: 3.9 10*3/uL — ABNORMAL LOW (ref 4.0–10.5)

## 2023-04-03 LAB — LIPID PANEL
Cholesterol: 180 mg/dL (ref 0–200)
HDL: 68.1 mg/dL (ref >39.00)
LDL Cholesterol: 98 mg/dL (ref 0–99)
NonHDL: 112.08
Total CHOL/HDL Ratio: 3
Triglycerides: 71 mg/dL (ref 0.0–149.0)
VLDL: 14.2 mg/dL (ref 0.0–40.0)

## 2023-04-03 LAB — TSH: TSH: 1.21 u[IU]/mL (ref 0.35–5.50)

## 2023-04-03 MED ORDER — CITALOPRAM HYDROBROMIDE 10 MG PO TABS: 10.0000 mg | ORAL_TABLET | Freq: Every day | ORAL | 0 refills | Status: DC

## 2023-04-03 NOTE — Progress Notes (Signed)
Subjective:    Patient ID: Ruth Stanley, female    DOB: 05/23/1961, 62 y.o.   MRN: [id number]  HPI Patient presents for yearly preventative medicine examination. She is a pleasant 62 year old female who  has a past medical history of Constipation, Hypertension, Seizures (HCC), and Ulcerative proctitis (HCC).  Essential Hypertension -prescribed lisinopril 10 mg daily.  She denies dizziness, lightheadedness, blurred vision, chest pain, shortness of breath BP Readings from Last 3 Encounters:  04/03/23 130/80  03/25/23 124/80  06/25/22 120/70   Ulcerative colitis-diagnosed in February 2017 and is managed by GI. Was last seen in September 2024. Does well with Apriso 0.375 mg 2 tablets daily. She is doing well with no acute flares   H/o CNS aneurysm repair-in 2006.  She has been on Lamictal 100 mg twice daily since that time. 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  Anxiety - she feels as though she is more anxious as of lately.Most her anxiety comes from her 72 and 1 year old grandchildren. She reports feeling on edge and is interested in taking something for the anxiety.   All immunizations and health maintenance protocols were reviewed with the patient and needed orders were placed.  Appropriate screening laboratory values were ordered for the patient including screening of hyperlipidemia, renal function and hepatic function.   Medication reconciliation,  past medical history, social history, problem list and allergies were reviewed in detail with the patient  Goals were established with regard to weight loss, exercise, and  diet in compliance with medications. She stays active and eats healthy  Wt Readings from Last 3 Encounters:  04/03/23 113 lb (51.3 kg)  03/25/23 112 lb (50.8 kg)  06/25/22 117 lb (53.1 kg)   She is having her mammogram on November 5th.  Review of Systems  Constitutional: Negative.   HENT: Negative.    Eyes:  Negative.   Respiratory: Negative.    Cardiovascular: Negative.   Gastrointestinal: Negative.   Endocrine: Negative.   Genitourinary: Negative.   Musculoskeletal: Negative.   Skin: Negative.   Allergic/Immunologic: Negative.   Neurological: Negative.   Hematological: Negative.   Psychiatric/Behavioral:  The patient is nervous/anxious.    Past Medical History:  Diagnosis Date   Constipation    Hypertension    Seizures (HCC)    last one 06/11/2012    Ulcerative proctitis (HCC)     Social History   Socioeconomic History   Marital status: Married    Spouse name: Not on file   Number of children: 1   Years of education: Not on file   Highest education level: Associate degree: academic program  Occupational History   Occupation: reitired  Tobacco Use   Smoking status: Former    Current packs/day: 0.00    Types: Cigarettes    Quit date: 10/15/2004    Years since quitting: 18.4   Smokeless tobacco: Never  Vaping Use   Vaping status: Never Used  Substance and Sexual Activity   Alcohol use: Yes    Alcohol/week: 0.0 standard drinks of alcohol    Comment: rare wine   Drug use: No   Sexual activity: Yes    Birth control/protection: Post-menopausal  Other Topics Concern   Not on file  Social History Narrative   Not on file   Social Determinants of Health   Financial Resource Strain: Low Risk  (06/24/2022)   Overall Financial Resource Strain (CARDIA)    Difficulty of Paying Living  Expenses: Not hard at all  Food Insecurity: No Food Insecurity (06/24/2022)   Hunger Vital Sign    Worried About Running Out of Food in the Last Year: Never true    Ran Out of Food in the Last Year: Never true  Transportation Needs: No Transportation Needs (06/24/2022)   PRAPARE - Administrator, Civil Service (Medical): No    Lack of Transportation (Non-Medical): No  Physical Activity: Sufficiently Active (06/24/2022)   Exercise Vital Sign    Days of Exercise per Week: 5 days     Minutes of Exercise per Session: 30 min  Stress: Stress Concern Present (06/24/2022)   [employer] of Occupational Health - Occupational Stress Questionnaire    Feeling of Stress : To some extent  Social Connections: Socially Integrated (06/24/2022)   Social Connection and Isolation Panel [NHANES]    Frequency of Communication with Friends and Family: Three times a week    Frequency of Social Gatherings with Friends and Family: Once a week    Attends Religious Services: 1 to 4 times per year    Active Member of [employer] or Organizations: Yes    Attends Banker Meetings: 1 to 4 times per year    Marital Status: Married  Catering manager Violence: Unknown (10/11/2021)   Received from [employer], [facility]   HITS    Physically Hurt: Not on file    Insult or Talk Down To: Not on file    Threaten Physical Harm: Not on file    Scream or Curse: Not on file    Past Surgical History:  Procedure Laterality Date   CEREBRAL ANEURYSM REPAIR  2006   COLONOSCOPY  07/2009   Christella Hartigan - Hx polyp   WISDOM TOOTH EXTRACTION      Family History  Problem Relation Age of Onset   Colon cancer Neg Hx    Colon polyps Neg Hx    Esophageal cancer Neg Hx    Rectal cancer Neg Hx    Stomach cancer Neg Hx    Breast cancer Neg Hx     No Known Allergies  Current Outpatient Medications on File Prior to Visit  Medication Sig Dispense Refill   aspirin 325 MG tablet Take 325 mg by mouth daily.     clindamycin (CLEOCIN T) 1 % lotion APPLY TO AFFECTED AREA TWICE A DAY 60 mL 3   lamoTRIgine (LAMICTAL) 100 MG tablet TAKE 1 TABLET IN THE       MORNING AND 1 TABLET IN THEEVENING 180 tablet 3   lisinopril (ZESTRIL) 10 MG tablet TAKE 1 TABLET DAILY 90 tablet 3   mesalamine (APRISO) 0.375 g 24 hr capsule TAKE 4 CAPSULES (1.5GRAMS  TOTAL) DAILY 360 capsule 0   Multiple Vitamins-Minerals (CENTRUM SILVER PO) Take by mouth.     predniSONE (DELTASONE) 10 MG tablet TAKE 1 TABLET DAILY AS     NEEDED  FOR ITCHING 30 tablet 1   triamcinolone (KENALOG) 0.1 % paste SMARTSIG:TO TEETH 5 Times Daily     triamcinolone ointment (KENALOG) 0.5 % APPLY 1 APPLICATION        TOPICALLY TWO TIMES A DAY 30 g 0   No current facility-administered medications on file prior to visit.    BP 130/80   Pulse 73   Temp 98 F (36.7 C) (Oral)   Ht 5\' 3"  (1.6 m)   Wt 113 lb (51.3 kg)   LMP 06/18/2010 (Exact Date)   SpO2 100%   BMI 20.02  kg/m       Objective:   Physical Exam Vitals and nursing note reviewed.  Constitutional:      General: She is not in acute distress.    Appearance: Normal appearance. She is not ill-appearing.  HENT:     Head: Normocephalic and atraumatic.     Right Ear: Tympanic membrane, ear canal and external ear normal. There is no impacted cerumen.     Left Ear: Tympanic membrane, ear canal and external ear normal. There is no impacted cerumen.     Nose: Nose normal. No congestion or rhinorrhea.     Mouth/Throat:     Mouth: Mucous membranes are moist.     Pharynx: Oropharynx is clear.  Eyes:     Extraocular Movements: Extraocular movements intact.     Conjunctiva/sclera: Conjunctivae normal.     Pupils: Pupils are equal, round, and reactive to light.  Neck:     Vascular: No carotid bruit.  Cardiovascular:     Rate and Rhythm: Normal rate and regular rhythm.     Pulses: Normal pulses.     Heart sounds: No murmur heard.    No friction rub. No gallop.  Pulmonary:     Effort: Pulmonary effort is normal.     Breath sounds: Normal breath sounds.  Abdominal:     General: Abdomen is flat. Bowel sounds are normal. There is no distension.     Palpations: Abdomen is soft. There is no mass.     Tenderness: There is no abdominal tenderness. There is no guarding or rebound.     Hernia: No hernia is present.  Musculoskeletal:        General: Normal range of motion.     Cervical back: Normal range of motion and neck supple.  Lymphadenopathy:     Cervical: No cervical adenopathy.   Skin:    General: Skin is warm and dry.     Capillary Refill: Capillary refill takes less than 2 seconds.  Neurological:     General: No focal deficit present.     Mental Status: She is alert and oriented to person, place, and time.  Psychiatric:        Mood and Affect: Mood normal.        Behavior: Behavior normal.        Thought Content: Thought content normal.        Judgment: Judgment normal.       Assessment & Plan:  1. Routine general medical examination at a health care facility Today patient counseled on age appropriate routine health concerns for screening and prevention, each reviewed and up to date or declined. Immunizations reviewed and up to date or declined. Labs ordered and reviewed. Risk factors for depression reviewed and negative. Hearing function and visual acuity are intact. ADLs screened and addressed as needed. Functional ability and level of safety reviewed and appropriate. Education, counseling and referrals performed based on assessed risks today. Patient provided with a copy of personalized plan for preventive services. - Follow up in one year or sooner if needed  2. Essential hypertension - Well controlled. No change in medication  - CBC with Differential/Platelet; Future - Comprehensive metabolic panel; Future - Lipid panel; Future - TSH; Future - TSH - Lipid panel - Comprehensive metabolic panel - CBC with Differential/Platelet  3. Cerebral aneurysm - Continue to monitor Q5 years with MRA - CBC with Differential/Platelet; Future - Comprehensive metabolic panel; Future - Lipid panel; Future - TSH; Future - TSH - Lipid panel -  Comprehensive metabolic panel - CBC with Differential/Platelet  4. History of seizure disorder - Continue with Lamictal  - CBC with Differential/Platelet; Future - Comprehensive metabolic panel; Future - Lipid panel; Future - TSH; Future - Lamotrigine level; Future - Lamotrigine level - TSH - Lipid panel -  Comprehensive metabolic panel - CBC with Differential/Platelet  5. Other ulcerative colitis with complication (HCC) - Per GI  - CBC with Differential/Platelet; Future - Comprehensive metabolic panel; Future - Lipid panel; Future - TSH; Future - TSH - Lipid panel - Comprehensive metabolic panel - CBC with Differential/Platelet  6. Anxiety - Will trial her on Celexa 10 mg. Advised to let me know how is she doing in 4 weeks or so  - citalopram (CELEXA) 10 MG tablet; Take 1 tablet (10 mg total) by mouth daily.  Dispense: 30 tablet; Refill: 0  Shirline Frees, NP

## 2023-04-05 LAB — LAMOTRIGINE LEVEL: Lamotrigine Lvl: 9 ug/mL (ref 2.5–15.0)

## 2023-04-27 ENCOUNTER — Other Ambulatory Visit: Payer: Self-pay | Admitting: Adult Health

## 2023-04-27 DIAGNOSIS — L299 Pruritus, unspecified: Secondary | ICD-10-CM

## 2023-04-28 ENCOUNTER — Encounter: Payer: Self-pay | Admitting: Adult Health

## 2023-04-28 ENCOUNTER — Other Ambulatory Visit: Payer: Self-pay | Admitting: Adult Health

## 2023-04-28 DIAGNOSIS — F419 Anxiety disorder, unspecified: Secondary | ICD-10-CM

## 2023-04-29 MED ORDER — CITALOPRAM HYDROBROMIDE 10 MG PO TABS: 10.0000 mg | ORAL_TABLET | Freq: Every day | ORAL | 0 refills | Status: DC

## 2023-04-29 NOTE — Telephone Encounter (Signed)
FYI

## 2023-05-27 ENCOUNTER — Other Ambulatory Visit: Payer: Self-pay | Admitting: Adult Health

## 2023-05-27 ENCOUNTER — Encounter: Payer: Self-pay | Admitting: Gastroenterology

## 2023-05-27 ENCOUNTER — Other Ambulatory Visit: Payer: Self-pay | Admitting: Gastroenterology

## 2023-05-27 DIAGNOSIS — L299 Pruritus, unspecified: Secondary | ICD-10-CM

## 2023-05-29 NOTE — Telephone Encounter (Signed)
I sent this patient a portal message about the dose she is currently one.  At the last office visit, we talked about her weaning the dose down. No reply yet to portal message.  Please try to reach her by phone and ask how many tablets she is taking a day so I know how to send the refill.  Thanks  - H. Danis

## 2023-05-31 ENCOUNTER — Other Ambulatory Visit: Payer: Self-pay | Admitting: Adult Health

## 2023-05-31 DIAGNOSIS — F419 Anxiety disorder, unspecified: Secondary | ICD-10-CM

## 2023-06-06 ENCOUNTER — Encounter: Payer: Self-pay | Admitting: Adult Health

## 2023-06-10 NOTE — Telephone Encounter (Signed)
FYI ok to D/C off med list?

## 2023-06-26 ENCOUNTER — Other Ambulatory Visit: Payer: Self-pay | Admitting: Adult Health

## 2023-06-26 DIAGNOSIS — L299 Pruritus, unspecified: Secondary | ICD-10-CM

## 2023-09-24 ENCOUNTER — Other Ambulatory Visit: Payer: Self-pay | Admitting: Adult Health

## 2023-09-24 DIAGNOSIS — L299 Pruritus, unspecified: Secondary | ICD-10-CM

## 2023-10-14 ENCOUNTER — Ambulatory Visit: Admitting: Adult Health

## 2023-10-14 ENCOUNTER — Encounter: Payer: Self-pay | Admitting: Adult Health

## 2023-10-14 VITALS — BP 130/80 | HR 62 | Temp 97.8°F | Ht 63.0 in | Wt 168.0 lb

## 2023-10-14 DIAGNOSIS — M65342 Trigger finger, left ring finger: Secondary | ICD-10-CM | POA: Diagnosis not present

## 2023-10-14 MED ORDER — METHYLPREDNISOLONE ACETATE 80 MG/ML IJ SUSP
80.0000 mg | Freq: Once | INTRAMUSCULAR | Status: AC
  Administered 2023-10-14: 80 mg via INTRA_ARTICULAR

## 2023-10-14 NOTE — Progress Notes (Signed)
 Subjective:    Patient ID: Ruth Stanley, female    DOB: 10-23-1960, 63 y.o.   MRN: [id number]  HPI 63 year old female who  has a past medical history of Constipation, Hypertension, Seizures (HCC), and Ulcerative proctitis (HCC).  She presents to the office today for recurrent trigger finger of her left ring finger. She reports swelling and a sticking snapping sensation of the finger for the last week. Marland Kitchen She was last evaluated for this in December 2023 at which time she d had a steroid injection performed. This worked well for her and she would like to do it again.      Review of Systems See HPI   Past Medical History:  Diagnosis Date   Constipation    Hypertension    Seizures (HCC)    last one 06/11/2012    Ulcerative proctitis (HCC)     Social History   Socioeconomic History   Marital status: Married    Spouse name: Not on file   Number of children: 1   Years of education: Not on file   Highest education level: Associate degree: academic program  Occupational History   Occupation: reitired  Tobacco Use   Smoking status: Former    Current packs/day: 0.00    Types: Cigarettes    Quit date: 10/15/2004    Years since quitting: 19.0   Smokeless tobacco: Never  Vaping Use   Vaping status: Never Used  Substance and Sexual Activity   Alcohol use: Yes    Alcohol/week: 0.0 standard drinks of alcohol    Comment: rare wine   Drug use: No   Sexual activity: Yes    Birth control/protection: Post-menopausal  Other Topics Concern   Not on file  Social History Narrative   Not on file   Social Drivers of Health   Financial Resource Strain: Low Risk  (06/24/2022)   Overall Financial Resource Strain (CARDIA)    Difficulty of Paying Living Expenses: Not hard at all  Food Insecurity: No Food Insecurity (06/24/2022)   Hunger Vital Sign    Worried About Running Out of Food in the Last Year: Never true    Ran Out of Food in the Last Year: Never true  Transportation  Needs: No Transportation Needs (06/24/2022)   PRAPARE - Administrator, Civil Service (Medical): No    Lack of Transportation (Non-Medical): No  Physical Activity: Sufficiently Active (06/24/2022)   Exercise Vital Sign    Days of Exercise per Week: 5 days    Minutes of Exercise per Session: 30 min  Stress: Stress Concern Present (06/24/2022)   [employer] of Occupational Health - Occupational Stress Questionnaire    Feeling of Stress : To some extent  Social Connections: Socially Integrated (06/24/2022)   Social Connection and Isolation Panel [NHANES]    Frequency of Communication with Friends and Family: Three times a week    Frequency of Social Gatherings with Friends and Family: Once a week    Attends Religious Services: 1 to 4 times per year    Active Member of [employer] or Organizations: Yes    Attends Banker Meetings: 1 to 4 times per year    Marital Status: Married  Catering manager Violence: Unknown (10/11/2021)   Received from [facility], [facility]   HITS    Physically Hurt: Not on file    Insult or Talk Down To: Not on file    Threaten Physical Harm: Not on file  Scream or Curse: Not on file    Past Surgical History:  Procedure Laterality Date   CEREBRAL ANEURYSM REPAIR  2006   COLONOSCOPY  07/2009   Christella Hartigan - Hx polyp   WISDOM TOOTH EXTRACTION      Family History  Problem Relation Age of Onset   Colon cancer Neg Hx    Colon polyps Neg Hx    Esophageal cancer Neg Hx    Rectal cancer Neg Hx    Stomach cancer Neg Hx    Breast cancer Neg Hx     No Known Allergies  Current Outpatient Medications on File Prior to Visit  Medication Sig Dispense Refill   aspirin 325 MG tablet Take 325 mg by mouth daily.     clindamycin (CLEOCIN T) 1 % lotion APPLY TO AFFECTED AREA TWICE A DAY 60 mL 3   lamoTRIgine (LAMICTAL) 100 MG tablet TAKE 1 TABLET IN THE       MORNING AND 1 TABLET IN THEEVENING 180 tablet 3   lisinopril (ZESTRIL) 10 MG  tablet TAKE 1 TABLET DAILY 90 tablet 3   mesalamine (APRISO) 0.375 g 24 hr capsule Take 2 capsules (0.75 g total) by mouth daily. 180 capsule 3   Multiple Vitamins-Minerals (CENTRUM SILVER PO) Take by mouth.     predniSONE (DELTASONE) 10 MG tablet TAKE 1 TABLET DAILY AS     NEEDED FOR ITCHING 30 tablet 1   triamcinolone (KENALOG) 0.1 % paste SMARTSIG:TO TEETH 5 Times Daily     triamcinolone ointment (KENALOG) 0.5 % APPLY 1 APPLICATION        TOPICALLY TWO TIMES A DAY 30 g 0   No current facility-administered medications on file prior to visit.    BP 130/80   Pulse 62   Temp 97.8 F (36.6 C) (Oral)   Ht 5\' 3"  (1.6 m)   Wt 168 lb (76.2 kg)   LMP 06/18/2010 (Exact Date)   SpO2 95%   BMI 29.76 kg/m       Objective:   Physical Exam Vitals and nursing note reviewed.  Constitutional:      Appearance: Normal appearance.  Musculoskeletal:        General: Tenderness present.     Left hand: Tenderness present.     Comments: Tender nodule felt over A1 pulley. Clicking and locking noted.   Skin:    General: Skin is warm and dry.     Capillary Refill: Capillary refill takes less than 2 seconds.  Neurological:     General: No focal deficit present.     Mental Status: She is alert and oriented to person, place, and time.  Psychiatric:        Mood and Affect: Mood normal.        Behavior: Behavior normal.        Thought Content: Thought content normal.        Judgment: Judgment normal.       Assessment & Plan:  1. Trigger ring finger of left hand (Primary)   We again discussed treatment options and she opted for repeat steroid injection. Verbal consent obtained. Palmer area cleansed with betadine and alcohol. Cold spray used for anesthesia. Using a 27 gauge needle, injected 0.3 ml of Depo Medrol 80 mg at the proximal phalanx. There were no signs of resistance during needle advancement. Patient tolerated procedure well with immediate relief in her symptoms.  - Discussed aftercare  instructions.  - methylPREDNISolone acetate (DEPO-MEDROL) injection 80 mg  Shirline Frees, NP

## 2023-11-16 ENCOUNTER — Other Ambulatory Visit: Payer: Self-pay | Admitting: Adult Health

## 2023-11-16 DIAGNOSIS — L299 Pruritus, unspecified: Secondary | ICD-10-CM

## 2024-01-08 ENCOUNTER — Other Ambulatory Visit: Payer: Self-pay | Admitting: Adult Health

## 2024-01-08 DIAGNOSIS — I671 Cerebral aneurysm, nonruptured: Secondary | ICD-10-CM

## 2024-01-08 DIAGNOSIS — I10 Essential (primary) hypertension: Secondary | ICD-10-CM

## 2024-05-14 ENCOUNTER — Other Ambulatory Visit: Payer: Self-pay | Admitting: Adult Health

## 2024-05-14 ENCOUNTER — Other Ambulatory Visit: Payer: Self-pay | Admitting: Gastroenterology

## 2024-05-14 DIAGNOSIS — L299 Pruritus, unspecified: Secondary | ICD-10-CM

## 2024-05-14 NOTE — Telephone Encounter (Signed)
 Patient need to schedule CPE for more refills.

## 2024-07-13 ENCOUNTER — Other Ambulatory Visit: Payer: Self-pay | Admitting: Adult Health

## 2024-07-13 DIAGNOSIS — L299 Pruritus, unspecified: Secondary | ICD-10-CM

## 2024-08-12 ENCOUNTER — Other Ambulatory Visit: Payer: Self-pay | Admitting: Gastroenterology
